# Patient Record
Sex: Female | Born: 1970 | Race: White | Hispanic: No | Marital: Married | Smoking: Never smoker
Health system: Southern US, Community
[De-identification: ages and names within clinical notes are randomized; demographics above are authoritative.]

## PROBLEM LIST (undated history)

## (undated) DIAGNOSIS — F909 Attention-deficit hyperactivity disorder, unspecified type: Secondary | ICD-10-CM

## (undated) DIAGNOSIS — L578 Other skin changes due to chronic exposure to nonionizing radiation: Secondary | ICD-10-CM

## (undated) DIAGNOSIS — R6882 Decreased libido: Secondary | ICD-10-CM

## (undated) DIAGNOSIS — F419 Anxiety disorder, unspecified: Secondary | ICD-10-CM

## (undated) DIAGNOSIS — G4700 Insomnia, unspecified: Secondary | ICD-10-CM

## (undated) DIAGNOSIS — Z0000 Encounter for general adult medical examination without abnormal findings: Principal | ICD-10-CM

## (undated) DIAGNOSIS — L709 Acne, unspecified: Secondary | ICD-10-CM

## (undated) HISTORY — DX: Decreased libido: R68.82

## (undated) HISTORY — PX: SKIN BIOPSY: SHX1

## (undated) HISTORY — DX: Encounter for general adult medical examination without abnormal findings: Z00.00

## (undated) HISTORY — DX: Attention-deficit hyperactivity disorder, unspecified type: F90.9

## (undated) HISTORY — DX: Anxiety disorder, unspecified: F41.9

## (undated) HISTORY — DX: Acne, unspecified: L70.9

## (undated) HISTORY — DX: Insomnia, unspecified: G47.00

## (undated) HISTORY — DX: Other skin changes due to chronic exposure to nonionizing radiation: L57.8

---
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
## 2000-04-06 ENCOUNTER — Other Ambulatory Visit: Admission: RE | Admit: 2000-04-06 | Discharge: 2000-04-06 | Payer: Self-pay | Admitting: Obstetrics and Gynecology

## 2001-04-12 ENCOUNTER — Other Ambulatory Visit: Admission: RE | Admit: 2001-04-12 | Discharge: 2001-04-12 | Payer: Self-pay | Admitting: Obstetrics and Gynecology

## 2004-01-14 HISTORY — PX: ANTERIOR CRUCIATE LIGAMENT REPAIR: SHX115

## 2004-01-24 ENCOUNTER — Ambulatory Visit (HOSPITAL_COMMUNITY): Admission: RE | Admit: 2004-01-24 | Discharge: 2004-01-24 | Payer: Self-pay | Admitting: Orthopedic Surgery

## 2004-01-24 ENCOUNTER — Ambulatory Visit (HOSPITAL_BASED_OUTPATIENT_CLINIC_OR_DEPARTMENT_OTHER): Admission: RE | Admit: 2004-01-24 | Discharge: 2004-01-24 | Payer: Self-pay | Admitting: Orthopedic Surgery

## 2004-04-09 ENCOUNTER — Other Ambulatory Visit: Admission: RE | Admit: 2004-04-09 | Discharge: 2004-04-09 | Payer: Self-pay | Admitting: Obstetrics and Gynecology

## 2004-10-21 ENCOUNTER — Inpatient Hospital Stay (HOSPITAL_COMMUNITY): Admission: AD | Admit: 2004-10-21 | Discharge: 2004-10-23 | Payer: Self-pay | Admitting: Obstetrics and Gynecology

## 2004-10-24 ENCOUNTER — Encounter: Admission: RE | Admit: 2004-10-24 | Discharge: 2004-11-23 | Payer: Self-pay | Admitting: Obstetrics and Gynecology

## 2004-11-24 ENCOUNTER — Encounter: Admission: RE | Admit: 2004-11-24 | Discharge: 2004-12-24 | Payer: Self-pay | Admitting: Obstetrics and Gynecology

## 2004-12-15 ENCOUNTER — Other Ambulatory Visit: Admission: RE | Admit: 2004-12-15 | Discharge: 2004-12-15 | Payer: Self-pay | Admitting: Obstetrics and Gynecology

## 2005-01-24 ENCOUNTER — Encounter: Admission: RE | Admit: 2005-01-24 | Discharge: 2005-02-23 | Payer: Self-pay | Admitting: Obstetrics and Gynecology

## 2005-02-24 ENCOUNTER — Encounter: Admission: RE | Admit: 2005-02-24 | Discharge: 2005-03-25 | Payer: Self-pay | Admitting: Obstetrics and Gynecology

## 2005-03-26 ENCOUNTER — Encounter: Admission: RE | Admit: 2005-03-26 | Discharge: 2005-04-25 | Payer: Self-pay | Admitting: Obstetrics and Gynecology

## 2005-04-26 ENCOUNTER — Encounter: Admission: RE | Admit: 2005-04-26 | Discharge: 2005-05-25 | Payer: Self-pay | Admitting: Obstetrics and Gynecology

## 2005-05-13 ENCOUNTER — Ambulatory Visit: Payer: Self-pay | Admitting: Family Medicine

## 2005-05-26 ENCOUNTER — Encounter: Admission: RE | Admit: 2005-05-26 | Discharge: 2005-06-25 | Payer: Self-pay | Admitting: Obstetrics and Gynecology

## 2006-10-19 ENCOUNTER — Encounter (INDEPENDENT_AMBULATORY_CARE_PROVIDER_SITE_OTHER): Payer: Self-pay | Admitting: Specialist

## 2006-10-19 ENCOUNTER — Inpatient Hospital Stay (HOSPITAL_COMMUNITY): Admission: AD | Admit: 2006-10-19 | Discharge: 2006-10-22 | Payer: Self-pay | Admitting: Obstetrics and Gynecology

## 2006-10-23 ENCOUNTER — Encounter: Admission: RE | Admit: 2006-10-23 | Discharge: 2006-11-22 | Payer: Self-pay | Admitting: Obstetrics and Gynecology

## 2006-11-23 ENCOUNTER — Encounter: Admission: RE | Admit: 2006-11-23 | Discharge: 2006-12-22 | Payer: Self-pay | Admitting: Obstetrics and Gynecology

## 2006-12-23 ENCOUNTER — Encounter: Admission: RE | Admit: 2006-12-23 | Discharge: 2007-01-22 | Payer: Self-pay | Admitting: Obstetrics and Gynecology

## 2007-01-23 ENCOUNTER — Encounter: Admission: RE | Admit: 2007-01-23 | Discharge: 2007-02-22 | Payer: Self-pay | Admitting: Obstetrics and Gynecology

## 2007-02-23 ENCOUNTER — Encounter: Admission: RE | Admit: 2007-02-23 | Discharge: 2007-03-24 | Payer: Self-pay | Admitting: Obstetrics and Gynecology

## 2007-03-25 ENCOUNTER — Encounter: Admission: RE | Admit: 2007-03-25 | Discharge: 2007-04-24 | Payer: Self-pay | Admitting: Obstetrics and Gynecology

## 2007-04-25 ENCOUNTER — Encounter: Admission: RE | Admit: 2007-04-25 | Discharge: 2007-05-24 | Payer: Self-pay | Admitting: Obstetrics and Gynecology

## 2007-05-25 ENCOUNTER — Encounter: Admission: RE | Admit: 2007-05-25 | Discharge: 2007-06-24 | Payer: Self-pay | Admitting: Obstetrics and Gynecology

## 2007-06-25 ENCOUNTER — Encounter: Admission: RE | Admit: 2007-06-25 | Discharge: 2007-07-25 | Payer: Self-pay | Admitting: Obstetrics and Gynecology

## 2007-07-26 ENCOUNTER — Encounter: Admission: RE | Admit: 2007-07-26 | Discharge: 2007-08-22 | Payer: Self-pay | Admitting: Obstetrics and Gynecology

## 2007-08-23 ENCOUNTER — Encounter: Admission: RE | Admit: 2007-08-23 | Discharge: 2007-09-22 | Payer: Self-pay | Admitting: Obstetrics and Gynecology

## 2007-09-23 ENCOUNTER — Encounter: Admission: RE | Admit: 2007-09-23 | Discharge: 2007-10-22 | Payer: Self-pay | Admitting: Obstetrics and Gynecology

## 2007-10-23 ENCOUNTER — Encounter: Admission: RE | Admit: 2007-10-23 | Discharge: 2007-10-24 | Payer: Self-pay | Admitting: Obstetrics and Gynecology

## 2008-06-12 ENCOUNTER — Telehealth: Payer: Self-pay | Admitting: Family Medicine

## 2008-06-22 ENCOUNTER — Telehealth: Payer: Self-pay | Admitting: *Deleted

## 2008-06-23 ENCOUNTER — Ambulatory Visit: Payer: Self-pay | Admitting: Family Medicine

## 2008-06-23 DIAGNOSIS — J189 Pneumonia, unspecified organism: Secondary | ICD-10-CM

## 2008-07-07 ENCOUNTER — Ambulatory Visit: Payer: Self-pay | Admitting: Family Medicine

## 2008-07-10 ENCOUNTER — Ambulatory Visit: Payer: Self-pay | Admitting: Family Medicine

## 2009-04-19 ENCOUNTER — Emergency Department (HOSPITAL_BASED_OUTPATIENT_CLINIC_OR_DEPARTMENT_OTHER): Admission: EM | Admit: 2009-04-19 | Discharge: 2009-04-19 | Payer: Self-pay | Admitting: Emergency Medicine

## 2009-04-19 ENCOUNTER — Ambulatory Visit: Payer: Self-pay | Admitting: Interventional Radiology

## 2010-06-01 ENCOUNTER — Encounter: Payer: Self-pay | Admitting: Pulmonary Disease

## 2010-06-26 ENCOUNTER — Encounter: Payer: Self-pay | Admitting: Pulmonary Disease

## 2010-07-28 ENCOUNTER — Institutional Professional Consult (permissible substitution) (INDEPENDENT_AMBULATORY_CARE_PROVIDER_SITE_OTHER): Payer: BC Managed Care – PPO | Admitting: Pulmonary Disease

## 2010-07-28 ENCOUNTER — Encounter: Payer: Self-pay | Admitting: Pulmonary Disease

## 2010-07-28 DIAGNOSIS — R9389 Abnormal findings on diagnostic imaging of other specified body structures: Secondary | ICD-10-CM

## 2010-07-28 DIAGNOSIS — R930 Abnormal findings on diagnostic imaging of skull and head, not elsewhere classified: Secondary | ICD-10-CM | POA: Insufficient documentation

## 2010-08-06 NOTE — Assessment & Plan Note (Signed)
Summary: questionable sarcodosis/jd   Visit Type:  Initial Consult Copy to:  Dr. Cleta Alberts Primary Provider/Referring Provider:  Dr. Amador Cunas  CC:  Pulmonary consult. Abnormal cxr.  History of Present Illness: 39/F, never smoker for evaluation of abnormal ACE levels to ' r/o sarcoidosis' Went to [facility]  -- cold in dec '11 , cough would not go away, sons sick ages 4 & 3  ACE 55, WC 6.2 , Hb 14 CXR 12/11 peribronchial thickening, repeated on jan' 12  - clear, no mention of LNs  Last such episode was 47yrs ago >> diagnosed with RUL pna after 2 MD visits Otherwise, she has no respiratory symptoms - denies cough, shortness of breath, skin lesions or eye problems. She runs upto 10 miles/ week, about 3-4 miles at a time . CXR in our system from jan'2010 appears nml  Preventive Screening-Counseling & Management  Alcohol-Tobacco     Alcohol drinks/day: rare     Smoking Status: never  Current Medications (verified): 1)  Multivitamins   Tabs (Multiple Vitamin) .... Take 1 Tablet By Mouth Once A Day 2)  B12 Powder .... 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  Smoking Status:  never Alcohol drinks/day:  rare  Review of Systems       The patient complains of productive cough and non-productive cough.  The patient denies shortness of breath with activity, shortness  of breath at rest, coughing up blood, chest pain, irregular heartbeats, acid heartburn, indigestion, loss of appetite, weight change, abdominal pain, difficulty swallowing, sore throat, tooth/dental problems, headaches, nasal congestion/difficulty breathing through nose, sneezing, itching, ear ache, anxiety, depression, hand/feet swelling, joint stiffness or pain, rash, change in color of mucus, and fever.    Vital Signs:  Patient profile:   40 year old female Height:      60 inches Weight:      133.2 pounds BMI:     26.11 O2 Sat:      98 % on Room air Temp:     98.1 degrees F oral Pulse rate:   106 / minute BP sitting:   110 / 80  (right arm) Cuff size:   regular  Vitals Entered By: Zackery Barefoot CMA (July 28, 2010 2:41 PM)  O2 Flow:  Room air CC: Pulmonary consult. Abnormal cxr Comments Medications reviewed with patient Verified contact number and pharmacy with patient Zackery Barefoot CMA  July 28, 2010 2:41 PM    Physical Exam  Additional Exam:  Gen. Pleasant, well-nourished, in no distress, normal affect ENT - no lesions, no post nasal drip Neck: No JVD, no thyromegaly, no carotid bruits Lungs: no use of accessory muscles, no dullness to percussion, clear without rales or rhonchi  Cardiovascular: Rhythm regular, heart sounds  normal, no murmurs or gallops, no peripheral edema Abdomen: soft and non-tender, no hepatosplenomegaly, BS normal. Musculoskeletal: No deformities, no cyanosis or clubbing Neuro:  alert, non focal     Impression & Recommendations:  Problem # 1:  ABNORMAL  RADIOLOGIAL EXAMINATION (ICD-793.9)  Doubt borderline high ACE level is of any significance. Will obtain CXR images to review but ' peribronchial thickening' seems to have resolved on FU film. None of the characteristic changes of sarcoid such as lymphadenopathy noted. She does not seem to have systemic involvement & lung function based on history appears adequate. As such , we discussed  common symptoms of sarcoid & she will report back if any of these arise or are perssitent . No further testing needed at this time.  Orders: Consultation Level III (04540)  Medications Added to Medication List This Visit: 1)  Multivitamins Tabs (Multiple vitamin) .... Take 1 tablet by mouth once a day 2)  B12 Powder  .... Daily  Patient Instructions: 1)  Copy sent to: dr Cleta Alberts 2)  No more testing indicated at this time 3)  Please schedule a follow-up appointment as needed. 4)  Let me know if  5)  - cough that wont go away 6)  -shortness of breath 7)  - red spots on your skin that wont go away 8)  -joint swellings    Immunization History:  Influenza Immunization History:    Influenza:  historical (04/21/2010)

## 2010-09-17 LAB — BASIC METABOLIC PANEL
Chloride: 105 mEq/L (ref 96–112)
Creatinine, Ser: 0.7 mg/dL (ref 0.4–1.2)
GFR calc Af Amer: >60 mL/min (ref >60)
GFR calc non Af Amer: >60 mL/min (ref >60)
Potassium: 4 mEq/L (ref 3.5–5.1)

## 2010-09-17 LAB — RAPID STREP SCREEN (MED CTR MEBANE ONLY): Streptococcus, Group A Screen (Direct): NEGATIVE

## 2010-09-17 LAB — CBC
MCV: 89.8 fL (ref 78.0–100.0)
Platelets: 143 10*3/uL — ABNORMAL LOW (ref 150–400)
RBC: 4.22 MIL/uL (ref 3.87–5.11)
WBC: 3.3 10*3/uL — ABNORMAL LOW (ref 4.0–10.5)

## 2010-09-17 LAB — URINALYSIS, ROUTINE W REFLEX MICROSCOPIC
Glucose, UA: NEGATIVE mg/dL
Ketones, ur: NEGATIVE mg/dL
Nitrite: NEGATIVE
Specific Gravity, Urine: 1.023 (ref 1.005–1.030)
pH: 7.5 (ref 5.0–8.0)

## 2010-09-17 LAB — DIFFERENTIAL
Eosinophils Absolute: 0.1 10*3/uL (ref 0.0–0.7)
Lymphocytes Relative: 50 % — ABNORMAL HIGH (ref 12–46)
Lymphs Abs: 1.8 10*3/uL (ref 0.7–4.0)
Monocytes Relative: 14 % — ABNORMAL HIGH (ref 3–12)
Neutrophils Relative %: 31 % — ABNORMAL LOW (ref 43–77)

## 2010-09-17 LAB — HEPATIC FUNCTION PANEL
Bilirubin, Direct: 0 mg/dL (ref 0.0–0.3)
Indirect Bilirubin: 0.4 mg/dL (ref 0.3–0.9)
Total Protein: 6 g/dL (ref 6.0–8.3)

## 2010-09-17 LAB — MONONUCLEOSIS SCREEN: Mono Screen: NEGATIVE

## 2010-09-17 LAB — URINE MICROSCOPIC-ADD ON

## 2010-09-17 LAB — PREGNANCY, URINE: Preg Test, Ur: NEGATIVE

## 2010-10-31 NOTE — Op Note (Signed)
NAME:  Anna May, Anna May             ACCOUNT NO.:  [id number]   MEDICAL RECORD NO.:  [id number]          PATIENT TYPE:  INP   LOCATION:  9174                          FACILITY:  [facility]   PHYSICIAN:  Duke Salvia. Marcelle Overlie, M.D.DATE OF BIRTH:  January 09, 1971   DATE OF PROCEDURE:  10/21/2004  DATE OF DISCHARGE:                                 OPERATIVE REPORT   DELIVERY NOTE:  The patient had a 2+ hour second stage, was noted to be  straight OA, +3 station, you could see the vertex without spreading the  labia.  Due to exhaustion, we discussed VE assist delivery which she  consented to.  The bladder was drained after she was prepped and draped.  The Kiwi VE was applied, traction effort coordinated with maternal pushing  effort x 1 effort over intact perineum with epidural and local anesthetic in  the perineum.  Apgars 8 and 9, female, pH was sent and is pending, placenta  was delivered spontaneously intact.  After the placenta was delivered, she  was given IV Pitocin.  Estimated blood loss 350.  Exploration revealed a  small fourth degree laceration.  An oversized glove was used to explore the  rectum which revealed, again, just a very low small fourth degree with a 75%  laceration of the sphincter muscle and capsule.  The rectal mucosa was  reapproximated with a 3-0 chromic on an SH needle.  Allis clamps were used  to grasp the ends of the sphincter capsule and muscle and this was  reapproximated with interrupted 2-0 Vicryl sutures in figure-of-eight  fashion x 4.  The vaginal mucosa was then closed with a running locked 3-0  Vicryl repeat suture, 3-0 Vicryl repeat sutures were used in interrupted  fashion to reapproximate the deep perineal tissue and perineal skin with  excellent hemostasis.  Mother and baby doing well at that point.  Pre-  partum, she had received IV antibiotics for GBS prophylaxis.      RMH/MEDQ  D:  10/21/2004  T:  10/21/2004  Job:  [id number]

## 2012-07-16 LAB — HM PAP SMEAR

## 2012-07-16 LAB — HM MAMMOGRAPHY

## 2012-07-18 ENCOUNTER — Other Ambulatory Visit: Payer: Self-pay | Admitting: Obstetrics and Gynecology

## 2012-09-16 ENCOUNTER — Encounter: Payer: Self-pay | Admitting: Family Medicine

## 2012-09-16 ENCOUNTER — Ambulatory Visit (INDEPENDENT_AMBULATORY_CARE_PROVIDER_SITE_OTHER): Payer: BC Managed Care – PPO | Admitting: Family Medicine

## 2012-09-16 VITALS — BP 123/88 | HR 62 | Temp 97.3°F | Ht 60.0 in | Wt 122.0 lb

## 2012-09-16 DIAGNOSIS — G4700 Insomnia, unspecified: Secondary | ICD-10-CM

## 2012-09-16 DIAGNOSIS — L709 Acne, unspecified: Secondary | ICD-10-CM

## 2012-09-16 DIAGNOSIS — L708 Other acne: Secondary | ICD-10-CM

## 2012-09-16 DIAGNOSIS — Z0000 Encounter for general adult medical examination without abnormal findings: Secondary | ICD-10-CM

## 2012-09-16 DIAGNOSIS — L578 Other skin changes due to chronic exposure to nonionizing radiation: Secondary | ICD-10-CM

## 2012-09-16 DIAGNOSIS — Z309 Encounter for contraceptive management, unspecified: Secondary | ICD-10-CM

## 2012-09-16 DIAGNOSIS — R6882 Decreased libido: Secondary | ICD-10-CM

## 2012-09-16 DIAGNOSIS — R5381 Other malaise: Secondary | ICD-10-CM

## 2012-09-16 HISTORY — DX: Acne, unspecified: L70.9

## 2012-09-16 HISTORY — DX: Insomnia, unspecified: G47.00

## 2012-09-16 HISTORY — DX: Decreased libido: R68.82

## 2012-09-16 HISTORY — DX: Other skin changes due to chronic exposure to nonionizing radiation: L57.8

## 2012-09-16 HISTORY — DX: Encounter for general adult medical examination without abnormal findings: Z00.00

## 2012-09-16 LAB — CBC
MCHC: 33.6 g/dL (ref 30.0–36.0)
MCV: 88.5 fl (ref 78.0–100.0)
Platelets: 226 10*3/uL (ref 150.0–400.0)
RDW: 13.8 % (ref 11.5–14.6)

## 2012-09-16 MED ORDER — NORGESTIM-ETH ESTRAD TRIPHASIC 0.18/0.215/0.25 MG-25 MCG PO TABS: 1.0000 | ORAL_TABLET | Freq: Every day | ORAL | Status: DC

## 2012-09-16 NOTE — Assessment & Plan Note (Signed)
Check testosterone, fsh and lh levels today

## 2012-09-16 NOTE — Assessment & Plan Note (Signed)
Start a probiotic, try [employer] and start ortho tricyclen lo

## 2012-09-16 NOTE — Assessment & Plan Note (Signed)
Encouraged good sleep hygiene and no exercising within 2 hours of bedtime

## 2012-09-16 NOTE — Patient Instructions (Addendum)
MegaRed caps daily Dig[employer]vantage probiotic  Preventive Care for Adults, Female A healthy lifestyle and preventive care can promote health and wellness. Preventive health guidelines for women include the following key practices.  A routine yearly physical is a good way to check with your caregiver about your health and preventive screening. It is a chance to share any concerns and updates on your health, and to receive a thorough exam.  Visit your dentist for a routine exam and preventive care every 6 months. Brush your teeth twice a day and floss once a day. Good oral hygiene prevents tooth decay and gum disease.  The frequency of eye exams is based on your age, health, family medical history, use of contact lenses, and other factors. Follow your caregiver's recommendations for frequency of eye exams.  Eat a healthy diet. Foods like vegetables, fruits, whole grains, low-fat dairy products, and lean protein foods contain the nutrients you need without too many calories. Decrease your intake of foods high in solid fats, added sugars, and salt. Eat the right amount of calories for you.Get information about a proper diet from your caregiver, if necessary.  Regular physical exercise is one of the most important things you can do for your health. Most adults should get at least 150 minutes of moderate-intensity exercise (any activity that increases your heart rate and causes you to sweat) each week. In addition, most adults need muscle-strengthening exercises on 2 or more days a week.  Maintain a healthy weight. The body mass index (BMI) is a screening tool to identify possible weight problems. It provides an estimate of body fat based on height and weight. Your caregiver can help determine your BMI, and can help you achieve or maintain a healthy weight.For adults 20 years and older:  A BMI below 18.5 is considered underweight.  A BMI of 18.5 to 24.9 is normal.  A BMI of 25 to 29.9 is  considered overweight.  A BMI of 30 and above is considered obese.  Maintain normal blood lipids and cholesterol levels by exercising and minimizing your intake of saturated fat. Eat a balanced diet with plenty of fruit and vegetables. Blood tests for lipids and cholesterol should begin at age 28 and be repeated every 5 years. If your lipid or cholesterol levels are high, you are over 50, or you are at high risk for heart disease, you may need your cholesterol levels checked more frequently.Ongoing high lipid and cholesterol levels should be treated with medicines if diet and exercise are not effective.  If you smoke, find out from your caregiver how to quit. If you do not use tobacco, do not start.  If you are pregnant, do not drink alcohol. If you are breastfeeding, be very cautious about drinking alcohol. If you are not pregnant and choose to drink alcohol, do not exceed 1 drink per day. One drink is considered to be 12 ounces (355 mL) of beer, 5 ounces (148 mL) of wine, or 1.5 ounces (44 mL) of liquor.  Avoid use of street drugs. Do not share needles with anyone. Ask for help if you need support or instructions about stopping the use of drugs.  High blood pressure causes heart disease and increases the risk of stroke. Your blood pressure should be checked at least every 1 to 2 years. Ongoing high blood pressure should be treated with medicines if weight loss and exercise are not effective.  If you are 35 to 42 years old, ask your caregiver if you should  take aspirin to prevent strokes.  Diabetes screening involves taking a blood sample to check your fasting blood sugar level. This should be done once every 3 years, after age 81, if you are within normal weight and without risk factors for diabetes. Testing should be considered at a younger age or be carried out more frequently if you are overweight and have at least 1 risk factor for diabetes.  Breast cancer screening is essential preventive  care for women. You should practice "breast self-awareness." This means understanding the normal appearance and feel of your breasts and may include breast self-examination. Any changes detected, no matter how small, should be reported to a caregiver. Women in their 64s and 30s should have a clinical breast exam (CBE) by a caregiver as part of a regular health exam every 1 to 3 years. After age 18, women should have a CBE every year. Starting at age 107, women should consider having a mammography (breast X-ray test) every year. Women who have a family history of breast cancer should talk to their caregiver about genetic screening. Women at a high risk of breast cancer should talk to their caregivers about having magnetic resonance imaging (MRI) and a mammography every year.  The Pap test is a screening test for cervical cancer. A Pap test can show cell changes on the cervix that might become cervical cancer if left untreated. A Pap test is a procedure in which cells are obtained and examined from the lower end of the uterus (cervix).  Women should have a Pap test starting at age 37.  Between ages 54 and 52, Pap tests should be repeated every 2 years.  Beginning at age 2, you should have a Pap test every 3 years as long as the past 3 Pap tests have been normal.  Some women have medical problems that increase the chance of getting cervical cancer. Talk to your caregiver about these problems. It is especially important to talk to your caregiver if a new problem develops soon after your last Pap test. In these cases, your caregiver may recommend more frequent screening and Pap tests.  The above recommendations are the same for women who have or have not gotten the vaccine for human papillomavirus (HPV).  If you had a hysterectomy for a problem that was not cancer or a condition that could lead to cancer, then you no longer need Pap tests. Even if you no longer need a Pap test, a regular exam is a good idea  to make sure no other problems are starting.  If you are between ages 23 and 37, and you have had normal Pap tests going back 10 years, you no longer need Pap tests. Even if you no longer need a Pap test, a regular exam is a good idea to make sure no other problems are starting.  If you have had past treatment for cervical cancer or a condition that could lead to cancer, you need Pap tests and screening for cancer for at least 20 years after your treatment.  If Pap tests have been discontinued, risk factors (such as a new sexual partner) need to be reassessed to determine if screening should be resumed.  The HPV test is an additional test that may be used for cervical cancer screening. The HPV test looks for the virus that can cause the cell changes on the cervix. The cells collected during the Pap test can be tested for HPV. The HPV test could be used to screen women  aged 30 years and older, and should be used in women of any age who have unclear Pap test results. After the age of 29, women should have HPV testing at the same frequency as a Pap test.  Colorectal cancer can be detected and often prevented. Most routine colorectal cancer screening begins at the age of 49 and continues through age 37. However, your caregiver may recommend screening at an earlier age if you have risk factors for colon cancer. On a yearly basis, your caregiver may provide home test kits to check for hidden blood in the stool. Use of a small camera at the end of a tube, to directly examine the colon (sigmoidoscopy or colonoscopy), can detect the earliest forms of colorectal cancer. Talk to your caregiver about this at age 34, when routine screening begins. Direct examination of the colon should be repeated every 5 to 10 years through age 27, unless early forms of pre-cancerous polyps or small growths are found.  Hepatitis C blood testing is recommended for all people born from [birth year] through 1965 and any individual with known  risks for hepatitis C.  Practice safe sex. Use condoms and avoid high-risk sexual practices to reduce the spread of sexually transmitted infections (STIs). STIs include gonorrhea, chlamydia, syphilis, trichomonas, herpes, HPV, and human immunodeficiency virus (HIV). Herpes, HIV, and HPV are viral illnesses that have no cure. They can result in disability, cancer, and death. Sexually active women aged 2 and younger should be checked for chlamydia. Older women with new or multiple partners should also be tested for chlamydia. Testing for other STIs is recommended if you are sexually active and at increased risk.  Osteoporosis is a disease in which the bones lose minerals and strength with aging. This can result in serious bone fractures. The risk of osteoporosis can be identified using a bone density scan. Women ages 20 and over and women at risk for fractures or osteoporosis should discuss screening with their caregivers. Ask your caregiver whether you should take a calcium supplement or vitamin D to reduce the rate of osteoporosis.  Menopause can be associated with physical symptoms and risks. Hormone replacement therapy is available to decrease symptoms and risks. You should talk to your caregiver about whether hormone replacement therapy is right for you.  Use sunscreen with sun protection factor (SPF) of 30 or more. Apply sunscreen liberally and repeatedly throughout the day. You should seek shade when your shadow is shorter than you. Protect yourself by wearing long sleeves, pants, a wide-brimmed hat, and sunglasses year round, whenever you are outdoors.  Once a month, do a whole body skin exam, using a mirror to look at the skin on your back. Notify your caregiver of new moles, moles that have irregular borders, moles that are larger than a pencil eraser, or moles that have changed in shape or color.  Stay current with required immunizations.  Influenza. You need a dose every fall (or winter).  The composition of the flu vaccine changes each year, so being vaccinated once is not enough.  Pneumococcal polysaccharide. You need 1 to 2 doses if you smoke cigarettes or if you have certain chronic medical conditions. You need 1 dose at age 40 (or older) if you have never been vaccinated.  Tetanus, diphtheria, pertussis (Tdap, Td). Get 1 dose of Tdap vaccine if you are younger than age 40, are over 39 and have contact with an infant, are a Research scientist (physical sciences), are pregnant, or simply want to be protected from whooping  cough. After that, you need a Td booster dose every 10 years. Consult your caregiver if you have not had at least 3 tetanus and diphtheria-containing shots sometime in your life or have a deep or dirty wound.  HPV. You need this vaccine if you are a woman age 46 or younger. The vaccine is given in 3 doses over 6 months.  Measles, mumps, rubella (MMR). You need at least 1 dose of MMR if you were born in 1957 or later. You may also need a second dose.  Meningococcal. If you are age 69 to 52 and a first-year college student living in a residence hall, or have one of several medical conditions, you need to get vaccinated against meningococcal disease. You may also need additional booster doses.  Zoster (shingles). If you are age 78 or older, you should get this vaccine.  Varicella (chickenpox). If you have never had chickenpox or you were vaccinated but received only 1 dose, talk to your caregiver to find out if you need this vaccine.  Hepatitis A. You need this vaccine if you have a specific risk factor for hepatitis A virus infection or you simply wish to be protected from this disease. The vaccine is usually given as 2 doses, 6 to 18 months apart.  Hepatitis B. You need this vaccine if you have a specific risk factor for hepatitis B virus infection or you simply wish to be protected from this disease. The vaccine is given in 3 doses, usually over 6 months. Preventive Services /  Frequency Ages 38 to 52  Blood pressure check.** / Every 1 to 2 years.  Lipid and cholesterol check.** / Every 5 years beginning at age 41.  Clinical breast exam.** / Every 3 years for women in their 56s and 30s.  Pap test.** / Every 2 years from ages 66 through 20. Every 3 years starting at age 35 through age 72 or 53 with a history of 3 consecutive normal Pap tests.  HPV screening.** / Every 3 years from ages 18 through ages 15 to 66 with a history of 3 consecutive normal Pap tests.  Hepatitis C blood test.** / For any individual with known risks for hepatitis C.  Skin self-exam. / Monthly.  Influenza immunization.** / Every year.  Pneumococcal polysaccharide immunization.** / 1 to 2 doses if you smoke cigarettes or if you have certain chronic medical conditions.  Tetanus, diphtheria, pertussis (Tdap, Td) immunization. / A one-time dose of Tdap vaccine. After that, you need a Td booster dose every 10 years.  HPV immunization. / 3 doses over 6 months, if you are 37 and younger.  Measles, mumps, rubella (MMR) immunization. / You need at least 1 dose of MMR if you were born in 1957 or later. You may also need a second dose.  Meningococcal immunization. / 1 dose if you are age 41 to 57 and a first-year college student living in a residence hall, or have one of several medical conditions, you need to get vaccinated against meningococcal disease. You may also need additional booster doses.  Varicella immunization.** / Consult your caregiver.  Hepatitis A immunization.** / Consult your caregiver. 2 doses, 6 to 18 months apart.  Hepatitis B immunization.** / Consult your caregiver. 3 doses usually over 6 months. Ages 41 to 27  Blood pressure check.** / Every 1 to 2 years.  Lipid and cholesterol check.** / Every 5 years beginning at age 63.  Clinical breast exam.** / Every year after age 39.  Mammogram.** /  Every year beginning at age 76 and continuing for as long as you are in  good health. Consult with your caregiver.  Pap test.** / Every 3 years starting at age 71 through age 24 or 70 with a history of 3 consecutive normal Pap tests.  HPV screening.** / Every 3 years from ages 71 through ages 74 to 52 with a history of 3 consecutive normal Pap tests.  Fecal occult blood test (FOBT) of stool. / Every year beginning at age 91 and continuing until age 29. You may not need to do this test if you get a colonoscopy every 10 years.  Flexible sigmoidoscopy or colonoscopy.** / Every 5 years for a flexible sigmoidoscopy or every 10 years for a colonoscopy beginning at age 79 and continuing until age 35.  Hepatitis C blood test.** / For all people born from [birth year] through 1965 and any individual with known risks for hepatitis C.  Skin self-exam. / Monthly.  Influenza immunization.** / Every year.  Pneumococcal polysaccharide immunization.** / 1 to 2 doses if you smoke cigarettes or if you have certain chronic medical conditions.  Tetanus, diphtheria, pertussis (Tdap, Td) immunization.** / A one-time dose of Tdap vaccine. After that, you need a Td booster dose every 10 years.  Measles, mumps, rubella (MMR) immunization. / You need at least 1 dose of MMR if you were born in 1957 or later. You may also need a second dose.  Varicella immunization.** / Consult your caregiver.  Meningococcal immunization.** / Consult your caregiver.  Hepatitis A immunization.** / Consult your caregiver. 2 doses, 6 to 18 months apart.  Hepatitis B immunization.** / Consult your caregiver. 3 doses, usually over 6 months. Ages 25 and over  Blood pressure check.** / Every 1 to 2 years.  Lipid and cholesterol check.** / Every 5 years beginning at age 105.  Clinical breast exam.** / Every year after age 80.  Mammogram.** / Every year beginning at age 81 and continuing for as long as you are in good health. Consult with your caregiver.  Pap test.** / Every 3 years starting at age 54 through  age 89 or 81 with a 3 consecutive normal Pap tests. Testing can be stopped between 65 and 70 with 3 consecutive normal Pap tests and no abnormal Pap or HPV tests in the past 10 years.  HPV screening.** / Every 3 years from ages 79 through ages 58 or 15 with a history of 3 consecutive normal Pap tests. Testing can be stopped between 65 and 70 with 3 consecutive normal Pap tests and no abnormal Pap or HPV tests in the past 10 years.  Fecal occult blood test (FOBT) of stool. / Every year beginning at age 71 and continuing until age 37. You may not need to do this test if you get a colonoscopy every 10 years.  Flexible sigmoidoscopy or colonoscopy.** / Every 5 years for a flexible sigmoidoscopy or every 10 years for a colonoscopy beginning at age 13 and continuing until age 24.  Hepatitis C blood test.** / For all people born from [birth year] through 1965 and any individual with known risks for hepatitis C.  Osteoporosis screening.** / A one-time screening for women ages 59 and over and women at risk for fractures or osteoporosis.  Skin self-exam. / Monthly.  Influenza immunization.** / Every year.  Pneumococcal polysaccharide immunization.** / 1 dose at age 53 (or older) if you have never been vaccinated.  Tetanus, diphtheria, pertussis (Tdap, Td) immunization. / A one-time dose of  Tdap vaccine if you are over 65 and have contact with an infant, are a Research scientist (physical sciences), or simply want to be protected from whooping cough. After that, you need a Td booster dose every 10 years.  Varicella immunization.** / Consult your caregiver.  Meningococcal immunization.** / Consult your caregiver.  Hepatitis A immunization.** / Consult your caregiver. 2 doses, 6 to 18 months apart.  Hepatitis B immunization.** / Check with your caregiver. 3 doses, usually over 6 months. ** Family history and personal history of risk and conditions may change your caregiver's recommendations. Document Released: 07/28/2001 Document  Revised: 08/24/2011 Document Reviewed: 10/27/2010 [facility] Patient Information 2013 [place], [place].   Insomnia Insomnia is frequent trouble falling and/or staying asleep. Insomnia can be a long term problem or a short term problem. Both are common. Insomnia can be a short term problem when the wakefulness is related to a certain stress or worry. Long term insomnia is often related to ongoing stress during waking hours and/or poor sleeping habits. Overtime, sleep deprivation itself can make the problem worse. Every little thing feels more severe because you are overtired and your ability to cope is decreased. CAUSES   Stress, anxiety, and depression.  Poor sleeping habits.  Distractions such as TV in the bedroom.  Naps close to bedtime.  Engaging in emotionally charged conversations before bed.  Technical reading before sleep.  Alcohol and other sedatives. They may make the problem worse. They can hurt normal sleep patterns and normal dream activity.  Stimulants such as caffeine for several hours prior to bedtime.  Pain syndromes and shortness of breath can cause insomnia.  Exercise late at night.  Changing time zones may cause sleeping problems (jet lag). It is sometimes helpful to have someone observe your sleeping patterns. They should look for periods of not breathing during the night (sleep apnea). They should also look to see how long those periods last. If you live alone or observers are uncertain, you can also be observed at a sleep clinic where your sleep patterns will be professionally monitored. Sleep apnea requires a checkup and treatment. Give your caregivers your medical history. Give your caregivers observations your family has made about your sleep.  SYMPTOMS   Not feeling rested in the morning.  Anxiety and restlessness at bedtime.  Difficulty falling and staying asleep. TREATMENT   Your caregiver may prescribe treatment for an underlying medical disorders.  Your caregiver can give advice or help if you are using alcohol or other drugs for self-medication. Treatment of underlying problems will usually eliminate insomnia problems.  Medications can be prescribed for short time use. They are generally not recommended for lengthy use.  Over-the-counter sleep medicines are not recommended for lengthy use. They can be habit forming.  You can promote easier sleeping by making lifestyle changes such as:  Using relaxation techniques that help with breathing and reduce muscle tension.  Exercising earlier in the day.  Changing your diet and the time of your last meal. No night time snacks.  Establish a regular time to go to bed.  Counseling can help with stressful problems and worry.  Soothing music and white noise may be helpful if there are background noises you cannot remove.  Stop tedious detailed work at least one hour before bedtime. HOME CARE INSTRUCTIONS   Keep a diary. Inform your caregiver about your progress. This includes any medication side effects. See your caregiver regularly. Take note of:  Times when you are asleep.  Times when you are awake  during the night.  The quality of your sleep.  How you feel the next day. This information will help your caregiver care for you.  Get out of bed if you are still awake after 15 minutes. Read or do some quiet activity. Keep the lights down. Wait until you feel sleepy and go back to bed.  Keep regular sleeping and waking hours. Avoid naps.  Exercise regularly.  Avoid distractions at bedtime. Distractions include watching television or engaging in any intense or detailed activity like attempting to balance the household checkbook.  Develop a bedtime ritual. Keep a familiar routine of bathing, brushing your teeth, climbing into bed at the same time each night, listening to soothing music. Routines increase the success of falling to sleep faster.  Use relaxation techniques. This can be  using breathing and muscle tension release routines. It can also include visualizing peaceful scenes. You can also help control troubling or intruding thoughts by keeping your mind occupied with boring or repetitive thoughts like the old concept of counting sheep. You can make it more creative like imagining planting one beautiful flower after another in your backyard garden.  During your day, work to eliminate stress. When this is not possible use some of the previous suggestions to help reduce the anxiety that accompanies stressful situations. MAKE SURE YOU:   Understand these instructions.  Will watch your condition.  Will get help right away if you are not doing well or get worse. Document Released: 05/29/2000 Document Revised: 08/24/2011 Document Reviewed: 06/29/2007 [facility] Patient Information 2013 [place], [place].

## 2012-09-16 NOTE — Progress Notes (Signed)
Patient ID: Anna May, female   DOB: [birth year], 42 y.o.   MRN: [id number] Anna May [id number] [birth year] 09/16/2012      Progress Note New Patient  Subjective  Chief Complaint  Chief Complaint  Patient presents with  . Establish Care    new patient- referred by Dr Vincente Poli about hormonal issues    HPI  Is a 42 year old Caucasian female who is here today to establish care. She follows with a gynecologist and has been having more complaints so her gynecologist suggested she set up with the primary care Dr. Her complaints include fatigue, dry mouth, low libido, increased facial acne and facial hair. Her cycles have become slightly irregular but been more regular again recently. A final complaint is of insomnia. She's trouble falling asleep and staying asleep. 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 LMP was about 2 weeks ago and was normal for her. She follows with dermatology Dr. Nicholas Lose and has had numerous lesions removed secondary to a family history of melanoma on both sites. So far she's only had 1 precancerous lesion. No other recent illness. She eats a heart healthy diet and exercises regularly including training for half marathons. No fevers, chest pain, palpitations, shortness of breath, GI or GU concerns noted.  Past Medical History  Diagnosis Date  . Preventative health care 09/16/2012  . Insomnia 09/16/2012  . Decreased libido 09/16/2012  . Acne 09/16/2012  . Sun-damaged skin 09/16/2012    Past Surgical History  Procedure Laterality Date  . Anterior cruciate ligament repair  01-2004    right leg - replacement  . Wisdom tooth extraction  1991    all 4  . Skin biopsy      multiple one with 17 stitches, 1 precancer lesion    Family History  Problem Relation Age of Onset  . Arthritis Mother     hands  . Cancer Maternal Grandmother     melanoma  . Cancer Maternal Grandfather 41    kidney  . Heart attack Maternal  Grandfather   . Alcohol abuse Maternal Grandfather   . Heart disease Maternal Grandfather   . Stroke Maternal Grandfather   . Cancer Paternal Grandmother     melanoma  . Cancer Paternal Grandfather     lung- smoker    History   Social History  . Marital Status: Married    Spouse Name: N/A    Number of Children: N/A  . Years of Education: N/A   Occupational History  . Not on file.   Social History Main Topics  . Smoking status: Never Smoker   . Smokeless tobacco: Never Used  . Alcohol Use: No  . Drug Use: No  . Sexually Active: Yes -- Female partner(s)   Other Topics Concern  . Not on file   Social History Narrative  . No narrative on file    No current outpatient prescriptions on file prior to visit.   No current facility-administered medications on file prior to visit.    No Known Allergies  Review of Systems  Review of Systems  Constitutional: Positive for malaise/fatigue. Negative for fever.  HENT: Negative for congestion.   Eyes: Negative for discharge.  Respiratory: Negative for shortness of breath.   Cardiovascular: Negative for chest pain, palpitations and leg swelling.  Gastrointestinal: Negative for nausea, abdominal pain and diarrhea.  Genitourinary: Negative for dysuria.  Musculoskeletal: Negative for falls.  Skin: Negative for rash.  Neurological: Negative  for loss of consciousness and headaches.  Endo/Heme/Allergies: Negative for polydipsia.  Psychiatric/Behavioral: Negative for depression and suicidal ideas. The patient has insomnia. The patient is not nervous/anxious.     Objective  BP 123/88  Pulse 62  Temp(Src) 97.3 F (36.3 C) (Temporal)  Ht 5' (1.524 m)  Wt 122 lb (55.339 kg)  BMI 23.83 kg/m2  SpO2 98%  LMP 09/02/2012  Physical Exam  Physical Exam  Constitutional: She is oriented to person, place, and time and well-developed, well-nourished, and in no distress. No distress.  HENT:  Head: Normocephalic and atraumatic.  Right  Ear: External ear normal.  Left Ear: External ear normal.  Nose: Nose normal.  Mouth/Throat: Oropharynx is clear and moist. No oropharyngeal exudate.  Eyes: Conjunctivae are normal. Pupils are equal, round, and reactive to light. Right eye exhibits no discharge. Left eye exhibits no discharge. No scleral icterus.  Neck: Normal range of motion. Neck supple. No thyromegaly present.  Cardiovascular: Normal rate, regular rhythm, normal heart sounds and intact distal pulses.   No murmur heard. Pulmonary/Chest: Effort normal and breath sounds normal. No respiratory distress. She has no wheezes. She has no rales.  Abdominal: Soft. Bowel sounds are normal. She exhibits no distension and no mass. There is no tenderness.  Musculoskeletal: Normal range of motion. She exhibits no edema and no tenderness.  Lymphadenopathy:    She has no cervical adenopathy.  Neurological: She is alert and oriented to person, place, and time. She has normal reflexes. No cranial nerve deficit. Coordination normal.  Skin: Skin is warm and dry. No rash noted. She is not diaphoretic.  Psychiatric: Mood, memory and affect normal.       Assessment & Plan  Preventative health care Encouraged heart healthy diet which she largely already does, regular exercise. Avoid trans fats, started on Ortho Tricyclen lo for contraception management and management of acne, decreased libido  Sun-damaged skin Precancerous lesion once but numerous other biopsies all benign. Encouraged sunscreen and covering of skin  Decreased libido Check testosterone, fsh and lh levels today  Insomnia Encouraged good sleep hygiene and no exercising within 2 hours of bedtime  Acne Start a probiotic, try Witch Hazel Astringent and start ortho tricyclen lo

## 2012-09-16 NOTE — Assessment & Plan Note (Signed)
Encouraged heart healthy diet which she largely already does, regular exercise. Avoid trans fats, started on Ortho Tricyclen lo for contraception management and management of acne, decreased libido

## 2012-09-16 NOTE — Assessment & Plan Note (Signed)
Precancerous lesion once but numerous other biopsies all benign. Encouraged sunscreen and covering of skin

## 2012-09-17 LAB — VITAMIN D 25 HYDROXY (VIT D DEFICIENCY, FRACTURES): Vit D, 25-Hydroxy: 27 ng/mL — ABNORMAL LOW (ref 30–89)

## 2012-09-19 LAB — RENAL FUNCTION PANEL
Albumin: 4.2 g/dL (ref 3.5–5.2)
BUN: 14 mg/dL (ref 6–23)
Chloride: 109 mEq/L (ref 96–112)
Creatinine, Ser: 0.8 mg/dL (ref 0.4–1.2)
GFR: 86.25 mL/min (ref >60.00)
Phosphorus: 3.9 mg/dL (ref 2.3–4.6)

## 2012-09-19 LAB — HEPATIC FUNCTION PANEL
AST: 39 U/L — ABNORMAL HIGH (ref 0–37)
Albumin: 4.2 g/dL (ref 3.5–5.2)
Alkaline Phosphatase: 44 U/L (ref 39–117)
Total Protein: 7.4 g/dL (ref 6.0–8.3)

## 2012-09-19 LAB — TSH: TSH: 0.66 u[IU]/mL (ref 0.35–5.50)

## 2012-09-19 LAB — LIPID PANEL: Cholesterol: 145 mg/dL (ref 0–200)

## 2012-09-19 LAB — MAGNESIUM: Magnesium: 2.1 mg/dL (ref 1.5–2.5)

## 2012-09-19 LAB — LUTEINIZING HORMONE: LH: 4.44 m[IU]/mL

## 2013-09-22 ENCOUNTER — Other Ambulatory Visit: Payer: Self-pay | Admitting: Obstetrics and Gynecology

## 2014-05-14 ENCOUNTER — Ambulatory Visit: Payer: Self-pay | Admitting: Physician Assistant

## 2014-05-16 ENCOUNTER — Ambulatory Visit (INDEPENDENT_AMBULATORY_CARE_PROVIDER_SITE_OTHER): Payer: Managed Care, Other (non HMO) | Admitting: Physician Assistant

## 2014-05-16 ENCOUNTER — Encounter: Payer: Self-pay | Admitting: Physician Assistant

## 2014-05-16 VITALS — BP 120/82 | HR 68 | Temp 97.9°F | Resp 16 | Ht 60.0 in | Wt 123.1 lb

## 2014-05-16 DIAGNOSIS — Z0000 Encounter for general adult medical examination without abnormal findings: Secondary | ICD-10-CM

## 2014-05-16 DIAGNOSIS — Z23 Encounter for immunization: Secondary | ICD-10-CM

## 2014-05-16 LAB — CBC
HEMATOCRIT: 41.2 % (ref 36.0–46.0)
HEMOGLOBIN: 13.5 g/dL (ref 12.0–15.0)
MCHC: 32.8 g/dL (ref 30.0–36.0)
MCV: 88.8 fl (ref 78.0–100.0)
Platelets: 252 10*3/uL (ref 150.0–400.0)
RBC: 4.64 Mil/uL (ref 3.87–5.11)
RDW: 13.9 % (ref 11.5–15.5)
WBC: 5.9 10*3/uL (ref 4.0–10.5)

## 2014-05-16 LAB — URINALYSIS, ROUTINE W REFLEX MICROSCOPIC
Bilirubin Urine: NEGATIVE
Hgb urine dipstick: NEGATIVE
Ketones, ur: NEGATIVE
LEUKOCYTES UA: NEGATIVE
NITRITE: NEGATIVE
PH: 6 (ref 5.0–8.0)
RBC / HPF: NONE SEEN (ref 0–?)
Specific Gravity, Urine: 1.015 (ref 1.000–1.030)
TOTAL PROTEIN, URINE-UPE24: NEGATIVE
Urine Glucose: NEGATIVE
Urobilinogen, UA: 0.2 (ref 0.0–1.0)
WBC UA: NONE SEEN (ref 0–?)

## 2014-05-16 LAB — BASIC METABOLIC PANEL
BUN: 16 mg/dL (ref 6–23)
CALCIUM: 9.2 mg/dL (ref 8.4–10.5)
CO2: 26 mEq/L (ref 19–32)
CREATININE: 0.9 mg/dL (ref 0.4–1.2)
Chloride: 104 mEq/L (ref 96–112)
GFR: 69.85 mL/min (ref >60.00)
Glucose, Bld: 83 mg/dL (ref 70–99)
Potassium: 4.4 mEq/L (ref 3.5–5.1)
Sodium: 136 mEq/L (ref 135–145)

## 2014-05-16 LAB — LIPID PANEL
CHOLESTEROL: 163 mg/dL (ref 0–200)
HDL: 70.1 mg/dL (ref >39.00)
LDL CALC: 73 mg/dL (ref 0–99)
NONHDL: 92.9
Total CHOL/HDL Ratio: 2
Triglycerides: 99 mg/dL (ref 0.0–149.0)
VLDL: 19.8 mg/dL (ref 0.0–40.0)

## 2014-05-16 LAB — HEPATIC FUNCTION PANEL
ALT: 19 U/L (ref 0–35)
AST: 23 U/L (ref 0–37)
Albumin: 4.2 g/dL (ref 3.5–5.2)
Alkaline Phosphatase: 45 U/L (ref 39–117)
BILIRUBIN DIRECT: 0 mg/dL (ref 0.0–0.3)
BILIRUBIN TOTAL: 0.5 mg/dL (ref 0.2–1.2)
Total Protein: 7.2 g/dL (ref 6.0–8.3)

## 2014-05-16 LAB — VITAMIN D 25 HYDROXY (VIT D DEFICIENCY, FRACTURES): VITD: 25.48 ng/mL — AB (ref 30.00–100.00)

## 2014-05-16 LAB — TSH: TSH: 1.52 u[IU]/mL (ref 0.35–4.50)

## 2014-05-16 NOTE — Progress Notes (Signed)
Pre visit review using our clinic review tool, if applicable. No additional management support is needed unless otherwise documented below in the visit note/SLS  

## 2014-05-16 NOTE — Patient Instructions (Signed)
Please stop by the lab for blood work. I will call you with your results and once I have faxed over your wellness form.  Keep up the great exercise regimen!  Preventive Care for Adults A healthy lifestyle and preventive care can promote health and wellness. Preventive health guidelines for women include the following key practices.  A routine yearly physical is a good way to check with your health care provider about your health and preventive screening. It is a chance to share any concerns and updates on your health and to receive a thorough exam.  Visit your dentist for a routine exam and preventive care every 6 months. Brush your teeth twice a day and floss once a day. Good oral hygiene prevents tooth decay and gum disease.  The frequency of eye exams is based on your age, health, family medical history, use of contact lenses, and other factors. Follow your health care provider's recommendations for frequency of eye exams.  Eat a healthy diet. Foods like vegetables, fruits, whole grains, low-fat dairy products, and lean protein foods contain the nutrients you need without too many calories. Decrease your intake of foods high in solid fats, added sugars, and salt. Eat the right amount of calories for you.Get information about a proper diet from your health care provider, if necessary.  Regular physical exercise is one of the most important things you can do for your health. Most adults should get at least 150 minutes of moderate-intensity exercise (any activity that increases your heart rate and causes you to sweat) each week. In addition, most adults need muscle-strengthening exercises on 2 or more days a week.  Maintain a healthy weight. The body mass index (BMI) is a screening tool to identify possible weight problems. It provides an estimate of body fat based on height and weight. Your health care provider can find your BMI and can help you achieve or maintain a healthy weight.For adults 20  years and older:  A BMI below 18.5 is considered underweight.  A BMI of 18.5 to 24.9 is normal.  A BMI of 25 to 29.9 is considered overweight.  A BMI of 30 and above is considered obese.  Maintain normal blood lipids and cholesterol levels by exercising and minimizing your intake of saturated fat. Eat a balanced diet with plenty of fruit and vegetables. Blood tests for lipids and cholesterol should begin at age 38 and be repeated every 5 years. If your lipid or cholesterol levels are high, you are over 50, or you are at high risk for heart disease, you may need your cholesterol levels checked more frequently.Ongoing high lipid and cholesterol levels should be treated with medicines if diet and exercise are not working.  If you smoke, find out from your health care provider how to quit. If you do not use tobacco, do not start.  Lung cancer screening is recommended for adults aged 36-80 years who are at high risk for developing lung cancer because of a history of smoking. A yearly low-dose CT scan of the lungs is recommended for people who have at least a 30-pack-year history of smoking and are a current smoker or have quit within the past 15 years. A pack year of smoking is smoking an average of 1 pack of cigarettes a day for 1 year (for example: 1 pack a day for 30 years or 2 packs a day for 15 years). Yearly screening should continue until the smoker has stopped smoking for at least 15 years. Yearly screening  should be stopped for people who develop a health problem that would prevent them from having lung cancer treatment.  If you are pregnant, do not drink alcohol. If you are breastfeeding, be very cautious about drinking alcohol. If you are not pregnant and choose to drink alcohol, do not have more than 1 drink per day. One drink is considered to be 12 ounces (355 mL) of beer, 5 ounces (148 mL) of wine, or 1.5 ounces (44 mL) of liquor.  Avoid use of street drugs. Do not share needles with  anyone. Ask for help if you need support or instructions about stopping the use of drugs.  High blood pressure causes heart disease and increases the risk of stroke. Your blood pressure should be checked at least every 1 to 2 years. Ongoing high blood pressure should be treated with medicines if weight loss and exercise do not work.  If you are 20-11 years old, ask your health care provider if you should take aspirin to prevent strokes.  Diabetes screening involves taking a blood sample to check your fasting blood sugar level. This should be done once every 3 years, after age 72, if you are within normal weight and without risk factors for diabetes. Testing should be considered at a younger age or be carried out more frequently if you are overweight and have at least 1 risk factor for diabetes.  Breast cancer screening is essential preventive care for women. You should practice "breast self-awareness." This means understanding the normal appearance and feel of your breasts and may include breast self-examination. Any changes detected, no matter how small, should be reported to a health care provider. Women in their 31s and 30s should have a clinical breast exam (CBE) by a health care provider as part of a regular health exam every 1 to 3 years. After age 56, women should have a CBE every year. Starting at age 64, women should consider having a mammogram (breast X-ray test) every year. Women who have a family history of breast cancer should talk to their health care provider about genetic screening. Women at a high risk of breast cancer should talk to their health care providers about having an MRI and a mammogram every year.  Breast cancer gene (BRCA)-related cancer risk assessment is recommended for women who have family members with BRCA-related cancers. BRCA-related cancers include breast, ovarian, tubal, and peritoneal cancers. Having family members with these cancers may be associated with an  increased risk for harmful changes (mutations) in the breast cancer genes BRCA1 and BRCA2. Results of the assessment will determine the need for genetic counseling and BRCA1 and BRCA2 testing.  Routine pelvic exams to screen for cancer are no longer recommended for nonpregnant women who are considered low risk for cancer of the pelvic organs (ovaries, uterus, and vagina) and who do not have symptoms. Ask your health care provider if a screening pelvic exam is right for you.  If you have had past treatment for cervical cancer or a condition that could lead to cancer, you need Pap tests and screening for cancer for at least 20 years after your treatment. If Pap tests have been discontinued, your risk factors (such as having a new sexual partner) need to be reassessed to determine if screening should be resumed. Some women have medical problems that increase the chance of getting cervical cancer. In these cases, your health care provider may recommend more frequent screening and Pap tests.  The HPV test is an additional test that  may be used for cervical cancer screening. The HPV test looks for the virus that can cause the cell changes on the cervix. The cells collected during the Pap test can be tested for HPV. The HPV test could be used to screen women aged 59 years and older, and should be used in women of any age who have unclear Pap test results. After the age of 67, women should have HPV testing at the same frequency as a Pap test.  Colorectal cancer can be detected and often prevented. Most routine colorectal cancer screening begins at the age of 54 years and continues through age 37 years. However, your health care provider may recommend screening at an earlier age if you have risk factors for colon cancer. On a yearly basis, your health care provider may provide home test kits to check for hidden blood in the stool. Use of a small camera at the end of a tube, to directly examine the colon  (sigmoidoscopy or colonoscopy), can detect the earliest forms of colorectal cancer. Talk to your health care provider about this at age 96, when routine screening begins. Direct exam of the colon should be repeated every 5-10 years through age 15 years, unless early forms of pre-cancerous polyps or small growths are found.  People who are at an increased risk for hepatitis B should be screened for this virus. You are considered at high risk for hepatitis B if:  You were born in a country where hepatitis B occurs often. Talk with your health care provider about which countries are considered high risk.  Your parents were born in a high-risk country and you have not received a shot to protect against hepatitis B (hepatitis B vaccine).  You have HIV or AIDS.  You use needles to inject street drugs.  You live with, or have sex with, someone who has hepatitis B.  You get hemodialysis treatment.  You take certain medicines for conditions like cancer, organ transplantation, and autoimmune conditions.  Hepatitis C blood testing is recommended for all people born from [birth year] through 1965 and any individual with known risks for hepatitis C.  Practice safe sex. Use condoms and avoid high-risk sexual practices to reduce the spread of sexually transmitted infections (STIs). STIs include gonorrhea, chlamydia, syphilis, trichomonas, herpes, HPV, and human immunodeficiency virus (HIV). Herpes, HIV, and HPV are viral illnesses that have no cure. They can result in disability, cancer, and death.  You should be screened for sexually transmitted illnesses (STIs) including gonorrhea and chlamydia if:  You are sexually active and are younger than 24 years.  You are older than 24 years and your health care provider tells you that you are at risk for this type of infection.  Your sexual activity has changed since you were last screened and you are at an increased risk for chlamydia or gonorrhea. Ask your health  care provider if you are at risk.  If you are at risk of being infected with HIV, it is recommended that you take a prescription medicine daily to prevent HIV infection. This is called preexposure prophylaxis (PrEP). You are considered at risk if:  You are a heterosexual woman, are sexually active, and are at increased risk for HIV infection.  You take drugs by injection.  You are sexually active with a partner who has HIV.  Talk with your health care provider about whether you are at high risk of being infected with HIV. If you choose to begin PrEP, you should first be  tested for HIV. You should then be tested every 3 months for as long as you are taking PrEP.  Osteoporosis is a disease in which the bones lose minerals and strength with aging. This can result in serious bone fractures or breaks. The risk of osteoporosis can be identified using a bone density scan. Women ages 64 years and over and women at risk for fractures or osteoporosis should discuss screening with their health care providers. Ask your health care provider whether you should take a calcium supplement or vitamin D to reduce the rate of osteoporosis.  Menopause can be associated with physical symptoms and risks. Hormone replacement therapy is available to decrease symptoms and risks. You should talk to your health care provider about whether hormone replacement therapy is right for you.  Use sunscreen. Apply sunscreen liberally and repeatedly throughout the day. You should seek shade when your shadow is shorter than you. Protect yourself by wearing long sleeves, pants, a wide-brimmed hat, and sunglasses year round, whenever you are outdoors.  Once a month, do a whole body skin exam, using a mirror to look at the skin on your back. Tell your health care provider of new moles, moles that have irregular borders, moles that are larger than a pencil eraser, or moles that have changed in shape or color.  Stay current with required  vaccines (immunizations).  Influenza vaccine. All adults should be immunized every year.  Tetanus, diphtheria, and acellular pertussis (Td, Tdap) vaccine. Pregnant women should receive 1 dose of Tdap vaccine during each pregnancy. The dose should be obtained regardless of the length of time since the last dose. Immunization is preferred during the 27th-36th week of gestation. An adult who has not previously received Tdap or who does not know her vaccine status should receive 1 dose of Tdap. This initial dose should be followed by tetanus and diphtheria toxoids (Td) booster doses every 10 years. Adults with an unknown or incomplete history of completing a 3-dose immunization series with Td-containing vaccines should begin or complete a primary immunization series including a Tdap dose. Adults should receive a Td booster every 10 years.  Varicella vaccine. An adult without evidence of immunity to varicella should receive 2 doses or a second dose if she has previously received 1 dose. Pregnant females who do not have evidence of immunity should receive the first dose after pregnancy. This first dose should be obtained before leaving the health care facility. The second dose should be obtained 4-8 weeks after the first dose.  Human papillomavirus (HPV) vaccine. Females aged 13-26 years who have not received the vaccine previously should obtain the 3-dose series. The vaccine is not recommended for use in pregnant females. However, pregnancy testing is not needed before receiving a dose. If a female is found to be pregnant after receiving a dose, no treatment is needed. In that case, the remaining doses should be delayed until after the pregnancy. Immunization is recommended for any person with an immunocompromised condition through the age of 45 years if she did not get any or all doses earlier. During the 3-dose series, the second dose should be obtained 4-8 weeks after the first dose. The third dose should be  obtained 24 weeks after the first dose and 16 weeks after the second dose.  Zoster vaccine. One dose is recommended for adults aged 68 years or older unless certain conditions are present.  Measles, mumps, and rubella (MMR) vaccine. Adults born before [birth year] generally are considered immune to measles and  mumps. Adults born in [birth year] or later should have 1 or more doses of MMR vaccine unless there is a contraindication to the vaccine or there is laboratory evidence of immunity to each of the three diseases. A routine second dose of MMR vaccine should be obtained at least 28 days after the first dose for students attending postsecondary schools, health care workers, or international travelers. People who received inactivated measles vaccine or an unknown type of measles vaccine during 1963-1967 should receive 2 doses of MMR vaccine. People who received inactivated mumps vaccine or an unknown type of mumps vaccine before 1979 and are at high risk for mumps infection should consider immunization with 2 doses of MMR vaccine. For females of childbearing age, rubella immunity should be determined. If there is no evidence of immunity, females who are not pregnant should be vaccinated. If there is no evidence of immunity, females who are pregnant should delay immunization until after pregnancy. Unvaccinated health care workers born before [birth year] who lack laboratory evidence of measles, mumps, or rubella immunity or laboratory confirmation of disease should consider measles and mumps immunization with 2 doses of MMR vaccine or rubella immunization with 1 dose of MMR vaccine.  Pneumococcal 13-valent conjugate (PCV13) vaccine. When indicated, a person who is uncertain of her immunization history and has no record of immunization should receive the PCV13 vaccine. An adult aged 36 years or older who has certain medical conditions and has not been previously immunized should receive 1 dose of PCV13 vaccine. This PCV13 should be  followed with a dose of pneumococcal polysaccharide (PPSV23) vaccine. The PPSV23 vaccine dose should be obtained at least 8 weeks after the dose of PCV13 vaccine. An adult aged 7 years or older who has certain medical conditions and previously received 1 or more doses of PPSV23 vaccine should receive 1 dose of PCV13. The PCV13 vaccine dose should be obtained 1 or more years after the last PPSV23 vaccine dose.  Pneumococcal polysaccharide (PPSV23) vaccine. When PCV13 is also indicated, PCV13 should be obtained first. All adults aged 28 years and older should be immunized. An adult younger than age 19 years who has certain medical conditions should be immunized. Any person who resides in a nursing home or long-term care facility should be immunized. An adult smoker should be immunized. People with an immunocompromised condition and certain other conditions should receive both PCV13 and PPSV23 vaccines. People with human immunodeficiency virus (HIV) infection should be immunized as soon as possible after diagnosis. Immunization during chemotherapy or radiation therapy should be avoided. Routine use of PPSV23 vaccine is not recommended for American Indians, [place] Natives, or people younger than 65 years unless there are medical conditions that require PPSV23 vaccine. When indicated, people who have unknown immunization and have no record of immunization should receive PPSV23 vaccine. One-time revaccination 5 years after the first dose of PPSV23 is recommended for people aged 19-64 years who have chronic kidney failure, nephrotic syndrome, asplenia, or immunocompromised conditions. People who received 1-2 doses of PPSV23 before age 66 years should receive another dose of PPSV23 vaccine at age 47 years or later if at least 5 years have passed since the previous dose. Doses of PPSV23 are not needed for people immunized with PPSV23 at or after age 39 years.  Meningococcal vaccine. Adults with asplenia or persistent  complement component deficiencies should receive 2 doses of quadrivalent meningococcal conjugate (MenACWY-D) vaccine. The doses should be obtained at least 2 months apart. Microbiologists working with certain meningococcal bacteria, [employer]  recruits, people at risk during an outbreak, and people who travel to or live in countries with a high rate of meningitis should be immunized. A first-year college student up through age 3 years who is living in a residence hall should receive a dose if she did not receive a dose on or after her 16th birthday. Adults who have certain high-risk conditions should receive one or more doses of vaccine.  Hepatitis A vaccine. Adults who wish to be protected from this disease, have certain high-risk conditions, work with hepatitis A-infected animals, work in hepatitis A research labs, or travel to or work in countries with a high rate of hepatitis A should be immunized. Adults who were previously unvaccinated and who anticipate close contact with an international adoptee during the first 60 days after arrival in the [place] from a country with a high rate of hepatitis A should be immunized.  Hepatitis B vaccine. Adults who wish to be protected from this disease, have certain high-risk conditions, may be exposed to blood or other infectious body fluids, are household contacts or sex partners of hepatitis B positive people, are clients or workers in certain care facilities, or travel to or work in countries with a high rate of hepatitis B should be immunized.  Haemophilus influenzae type b (Hib) vaccine. A previously unvaccinated person with asplenia or sickle cell disease or having a scheduled splenectomy should receive 1 dose of Hib vaccine. Regardless of previous immunization, a recipient of a hematopoietic stem cell transplant should receive a 3-dose series 6-12 months after her successful transplant. Hib vaccine is not recommended for adults with HIV  infection. Preventive Services / Frequency Ages 93 to 67 years  Blood pressure check.** / Every 1 to 2 years.  Lipid and cholesterol check.** / Every 5 years beginning at age 50.  Clinical breast exam.** / Every 3 years for women in their 83s and 89s.  BRCA-related cancer risk assessment.** / For women who have family members with a BRCA-related cancer (breast, ovarian, tubal, or peritoneal cancers).  Pap test.** / Every 2 years from ages 64 through 35. Every 3 years starting at age 61 through age 96 or 73 with a history of 3 consecutive normal Pap tests.  HPV screening.** / Every 3 years from ages 94 through ages 65 to 82 with a history of 3 consecutive normal Pap tests.  Hepatitis C blood test.** / For any individual with known risks for hepatitis C.  Skin self-exam. / Monthly.  Influenza vaccine. / Every year.  Tetanus, diphtheria, and acellular pertussis (Tdap, Td) vaccine.** / Consult your health care provider. Pregnant women should receive 1 dose of Tdap vaccine during each pregnancy. 1 dose of Td every 10 years.  Varicella vaccine.** / Consult your health care provider. Pregnant females who do not have evidence of immunity should receive the first dose after pregnancy.  HPV vaccine. / 3 doses over 6 months, if 43 and younger. The vaccine is not recommended for use in pregnant females. However, pregnancy testing is not needed before receiving a dose.  Measles, mumps, rubella (MMR) vaccine.** / You need at least 1 dose of MMR if you were born in 1957 or later. You may also need a 2nd dose. For females of childbearing age, rubella immunity should be determined. If there is no evidence of immunity, females who are not pregnant should be vaccinated. If there is no evidence of immunity, females who are pregnant should delay immunization until after pregnancy.  Pneumococcal 13-valent  conjugate (PCV13) vaccine.** / Consult your health care provider.  Pneumococcal polysaccharide  (PPSV23) vaccine.** / 1 to 2 doses if you smoke cigarettes or if you have certain conditions.  Meningococcal vaccine.** / 1 dose if you are age 35 to 59 years and a Market researcher living in a residence hall, or have one of several medical conditions, you need to get vaccinated against meningococcal disease. You may also need additional booster doses.  Hepatitis A vaccine.** / Consult your health care provider.  Hepatitis B vaccine.** / Consult your health care provider.  Haemophilus influenzae type b (Hib) vaccine.** / Consult your health care provider. Ages 3 to 26 years  Blood pressure check.** / Every 1 to 2 years.  Lipid and cholesterol check.** / Every 5 years beginning at age 8 years.  Lung cancer screening. / Every year if you are aged 110-80 years and have a 30-pack-year history of smoking and currently smoke or have quit within the past 15 years. Yearly screening is stopped once you have quit smoking for at least 15 years or develop a health problem that would prevent you from having lung cancer treatment.  Clinical breast exam.** / Every year after age 26 years.  BRCA-related cancer risk assessment.** / For women who have family members with a BRCA-related cancer (breast, ovarian, tubal, or peritoneal cancers).  Mammogram.** / Every year beginning at age 5 years and continuing for as long as you are in good health. Consult with your health care provider.  Pap test.** / Every 3 years starting at age 20 years through age 90 or 76 years with a history of 3 consecutive normal Pap tests.  HPV screening.** / Every 3 years from ages 31 years through ages 11 to 40 years with a history of 3 consecutive normal Pap tests.  Fecal occult blood test (FOBT) of stool. / Every year beginning at age 30 years and continuing until age 2 years. You may not need to do this test if you get a colonoscopy every 10 years.  Flexible sigmoidoscopy or colonoscopy.** / Every 5 years for a  flexible sigmoidoscopy or every 10 years for a colonoscopy beginning at age 74 years and continuing until age 32 years.  Hepatitis C blood test.** / For all people born from [birth year] through 1965 and any individual with known risks for hepatitis C.  Skin self-exam. / Monthly.  Influenza vaccine. / Every year.  Tetanus, diphtheria, and acellular pertussis (Tdap/Td) vaccine.** / Consult your health care provider. Pregnant women should receive 1 dose of Tdap vaccine during each pregnancy. 1 dose of Td every 10 years.  Varicella vaccine.** / Consult your health care provider. Pregnant females who do not have evidence of immunity should receive the first dose after pregnancy.  Zoster vaccine.** / 1 dose for adults aged 104 years or older.  Measles, mumps, rubella (MMR) vaccine.** / You need at least 1 dose of MMR if you were born in 1957 or later. You may also need a 2nd dose. For females of childbearing age, rubella immunity should be determined. If there is no evidence of immunity, females who are not pregnant should be vaccinated. If there is no evidence of immunity, females who are pregnant should delay immunization until after pregnancy.  Pneumococcal 13-valent conjugate (PCV13) vaccine.** / Consult your health care provider.  Pneumococcal polysaccharide (PPSV23) vaccine.** / 1 to 2 doses if you smoke cigarettes or if you have certain conditions.  Meningococcal vaccine.** / Consult your health care provider.  Hepatitis  A vaccine.** / Consult your health care provider.  Hepatitis B vaccine.** / Consult your health care provider.  Haemophilus influenzae type b (Hib) vaccine.** / Consult your health care provider. Ages 7 years and over  Blood pressure check.** / Every 1 to 2 years.  Lipid and cholesterol check.** / Every 5 years beginning at age 46 years.  Lung cancer screening. / Every year if you are aged 38-80 years and have a 30-pack-year history of smoking and currently smoke or have  quit within the past 15 years. Yearly screening is stopped once you have quit smoking for at least 15 years or develop a health problem that would prevent you from having lung cancer treatment.  Clinical breast exam.** / Every year after age 13 years.  BRCA-related cancer risk assessment.** / For women who have family members with a BRCA-related cancer (breast, ovarian, tubal, or peritoneal cancers).  Mammogram.** / Every year beginning at age 35 years and continuing for as long as you are in good health. Consult with your health care provider.  Pap test.** / Every 3 years starting at age 63 years through age 85 or 17 years with 3 consecutive normal Pap tests. Testing can be stopped between 65 and 70 years with 3 consecutive normal Pap tests and no abnormal Pap or HPV tests in the past 10 years.  HPV screening.** / Every 3 years from ages 58 years through ages 9 or 68 years with a history of 3 consecutive normal Pap tests. Testing can be stopped between 65 and 70 years with 3 consecutive normal Pap tests and no abnormal Pap or HPV tests in the past 10 years.  Fecal occult blood test (FOBT) of stool. / Every year beginning at age 35 years and continuing until age 33 years. You may not need to do this test if you get a colonoscopy every 10 years.  Flexible sigmoidoscopy or colonoscopy.** / Every 5 years for a flexible sigmoidoscopy or every 10 years for a colonoscopy beginning at age 81 years and continuing until age 37 years.  Hepatitis C blood test.** / For all people born from [birth year] through 1965 and any individual with known risks for hepatitis C.  Osteoporosis screening.** / A one-time screening for women ages 90 years and over and women at risk for fractures or osteoporosis.  Skin self-exam. / Monthly.  Influenza vaccine. / Every year.  Tetanus, diphtheria, and acellular pertussis (Tdap/Td) vaccine.** / 1 dose of Td every 10 years.  Varicella vaccine.** / Consult your health care  provider.  Zoster vaccine.** / 1 dose for adults aged 64 years or older.  Pneumococcal 13-valent conjugate (PCV13) vaccine.** / Consult your health care provider.  Pneumococcal polysaccharide (PPSV23) vaccine.** / 1 dose for all adults aged 13 years and older.  Meningococcal vaccine.** / Consult your health care provider.  Hepatitis A vaccine.** / Consult your health care provider.  Hepatitis B vaccine.** / Consult your health care provider.  Haemophilus influenzae type b (Hib) vaccine.** / Consult your health care provider. ** Family history and personal history of risk and conditions may change your health care provider's recommendations. Document Released: 07/28/2001 Document Revised: 10/16/2013 Document Reviewed: 10/27/2010 [facility] Patient Information 2015 [place], [place]. This information is not intended to replace advice given to you by your health care provider. Make sure you discuss any questions you have with your health care provider.

## 2014-05-16 NOTE — Assessment & Plan Note (Signed)
I have reviewed the patient's medical history in detail and updated the computerized patient record.  Flu shot up-to-date. TDaP given today.  Mammogram and PAP up-to-date. Will obtain fasting labs at today's visit. Preventive healthcare discussed at visit.  Handout given.

## 2014-05-16 NOTE — Progress Notes (Signed)
Patient presents to clinic today for annual exam.  Patient is fasting for labs.  Acute Concerns: No acute concerns at today's visit.  Health Maintenance: Dental -- up-to-date Vision -- up-to-date Immunizations -- Has had flu shot 02/2014.  Will update Tetanus today. Mammogram -- Endorses last mammogram April 2015.  No abnormal findings. PAP -- up-to-date.  Past Medical History  Diagnosis Date  . Preventative health care 09/16/2012  . Insomnia 09/16/2012  . Decreased libido 09/16/2012  . Acne 09/16/2012  . Sun-damaged skin 09/16/2012    Past Surgical History  Procedure Laterality Date  . Anterior cruciate ligament repair  01-2004    right leg - replacement  . Wisdom tooth extraction  1991    all 4  . Skin biopsy      multiple one with 17 stitches, 1 precancer lesion    Current Outpatient Prescriptions on File Prior to Visit  Medication Sig Dispense Refill  . Norgestimate-Ethinyl Estradiol Triphasic (ORTHO TRI-CYCLEN LO) 0.18/0.215/0.25 MG-25 MCG tab Take 1 tablet by mouth daily. Start the first Sunday after next cycle 1 Package 5   No current facility-administered medications on file prior to visit.    No Known Allergies  Family History  Problem Relation Age of Onset  . Arthritis Mother     hands  . Cancer Maternal Grandmother     melanoma  . Cancer Maternal Grandfather 1383    kidney  . Heart attack Maternal Grandfather   . Alcohol abuse Maternal Grandfather   . Heart disease Maternal Grandfather   . Stroke Maternal Grandfather   . Cancer Paternal Grandmother     melanoma  . Cancer Paternal Grandfather     lung- smoker    History   Social History  . Marital Status: Married    Spouse Name: N/A    Number of Children: N/A  . Years of Education: N/A   Occupational History  . Not on file.   Social History Main Topics  . Smoking status: Never Smoker   . Smokeless tobacco: Never Used  . Alcohol Use: No  . Drug Use: No  . Sexual Activity:    Partners: Male     Other Topics Concern  . Not on file   Social History Narrative   Review of Systems  Constitutional: Negative for fever and weight loss.  HENT: Negative for ear discharge, ear pain, hearing loss and tinnitus.   Eyes: Negative for blurred vision, double vision, photophobia and pain.  Respiratory: Negative for shortness of breath.   Cardiovascular: Negative for chest pain and palpitations.  Gastrointestinal: Negative for heartburn, nausea, vomiting, abdominal pain, diarrhea, constipation, blood in stool and melena.  Genitourinary: Negative for dysuria, urgency, frequency, hematuria and flank pain.  Neurological: Negative for dizziness, loss of consciousness and headaches.  Psychiatric/Behavioral: Negative for depression, suicidal ideas, hallucinations and substance abuse. The patient is not nervous/anxious and does not have insomnia.    BP 120/82 mmHg  Pulse 68  Temp(Src) 97.9 F (36.6 C) (Oral)  Resp 16  Ht 5' (1.524 m)  Wt 123 lb 2 oz (55.849 kg)  BMI 24.05 kg/m2  SpO2 100%  LMP 04/25/2014  Physical Exam  Constitutional: She is oriented to person, place, and time and well-developed, well-nourished, and in no distress.  HENT:  Head: Normocephalic and atraumatic.  Right Ear: External ear normal.  Left Ear: External ear normal.  Nose: Nose normal.  Mouth/Throat: Oropharynx is clear and moist. No oropharyngeal exudate.  TM within normal limits bilaterally.  Eyes: Conjunctivae and EOM are normal. Pupils are equal, round, and reactive to light.  Neck: Neck supple. No thyromegaly present.  Cardiovascular: Normal rate, regular rhythm, normal heart sounds and intact distal pulses.   Pulmonary/Chest: Effort normal and breath sounds normal. No respiratory distress. She has no wheezes. She has no rales. She exhibits no tenderness.  Abdominal: Soft. Bowel sounds are normal. She exhibits no distension and no mass. There is no tenderness. There is no rebound and no guarding.   Lymphadenopathy:    She has no cervical adenopathy.  Neurological: She is alert and oriented to person, place, and time. No cranial nerve deficit.  Skin: Skin is warm and dry. No rash noted.  Psychiatric: Affect normal.  Vitals reviewed.  Assessment/Plan: Visit for preventive health examination I have reviewed the patient's medical history in detail and updated the computerized patient record.  Flu shot up-to-date. TDaP given today.  Mammogram and PAP up-to-date. Will obtain fasting labs at today's visit. Preventive healthcare discussed at visit.  Handout given.

## 2014-05-17 LAB — NICOTINE/COTININE METABOLITES

## 2014-06-04 ENCOUNTER — Encounter: Payer: Self-pay | Admitting: Family Medicine

## 2014-06-05 ENCOUNTER — Telehealth: Payer: Self-pay | Admitting: Family Medicine

## 2014-06-05 NOTE — Telephone Encounter (Signed)
I am not sure what they are referring to her labs were too> did not run a straight iron study but did run a CBC and that was completely normal. Please let her know. No concerns identified

## 2014-06-05 NOTE — Telephone Encounter (Signed)
Caller name:Lindor Janiyah Relation to ZH:YQMVpt:self Call back [phone] Pharmacy:  Reason for call: pt is wanting to speak with you regarding her lab results, states she reviewed her results on my chart and states  [employer] sent her a summary report and her blood iron level is very high and she wants to know if she should be concerned,  Labs were done 3 weeks ago

## 2014-06-06 NOTE — Telephone Encounter (Signed)
LM for patient to return the call.  

## 2014-06-06 NOTE — Telephone Encounter (Signed)
Call Documentation      Wende MottGaye H Foster, RN at 06/06/2014 12:00 PM     Status: Signed       Expand All Collapse All   Spoke with patient and reviewed per recommendations. Patient will send the results that she was given so that we will have a comparison.             Dedra SkeensStephanie S Stevens at 06/06/2014 10:33 AM     Status: Signed       Expand All Collapse All   Patient returned phone call. Best # [phone]            Wende MottGaye H Foster, RN at 06/06/2014 9:50 AM     Status: Signed       Expand All Collapse All   LM for patient to return the call.            Bradd CanaryStacey A Blyth, MD at 06/05/2014 7:56 PM     Status: Signed       Expand All Collapse All   I am not sure what they are referring to her labs were too> did not run a straight iron study but did run a CBC and that was completely normal. Please let her know. No concerns identified            Alleen BorneGwendolyn Davis at 06/05/2014 10:13 AM     Status: Signed       Expand All Collapse All   Caller name:Ocallaghan Seleny Relation to UJ:WJXBpt:self Call back [phone] Pharmacy:  Reason for call: pt is wanting to speak with you regarding her lab results, states she reviewed her results on my chart and states [employer] sent her a summary report and her blood iron level is very high and she wants to know if she should be concerned, Labs were done 3 weeks ago

## 2014-06-06 NOTE — Telephone Encounter (Signed)
Patient returned phone call. Best # [phone]

## 2014-06-06 NOTE — Telephone Encounter (Signed)
Spoke with patient and reviewed per recommendations. Patient will send the results that she was given so that we will have a comparison.

## 2014-10-15 ENCOUNTER — Other Ambulatory Visit: Payer: Self-pay

## 2014-10-16 LAB — CYTOLOGY - PAP

## 2015-01-28 ENCOUNTER — Other Ambulatory Visit: Payer: Self-pay | Admitting: Obstetrics and Gynecology

## 2015-01-28 DIAGNOSIS — N6315 Unspecified lump in the right breast, overlapping quadrants: Secondary | ICD-10-CM

## 2015-01-28 DIAGNOSIS — N6310 Unspecified lump in the right breast, unspecified quadrant: Principal | ICD-10-CM

## 2015-02-04 ENCOUNTER — Ambulatory Visit
Admission: RE | Admit: 2015-02-04 | Discharge: 2015-02-04 | Disposition: A | Discharge status: Home / Self Care | Payer: Managed Care, Other (non HMO) | Source: Ambulatory Visit | Attending: Obstetrics and Gynecology | Admitting: Obstetrics and Gynecology

## 2015-02-04 DIAGNOSIS — N6310 Unspecified lump in the right breast, unspecified quadrant: Principal | ICD-10-CM

## 2015-02-04 DIAGNOSIS — N6315 Unspecified lump in the right breast, overlapping quadrants: Secondary | ICD-10-CM

## 2015-03-04 ENCOUNTER — Encounter: Payer: Managed Care, Other (non HMO) | Admitting: Physician Assistant

## 2015-03-11 ENCOUNTER — Telehealth: Payer: Self-pay | Admitting: *Deleted

## 2015-03-11 NOTE — Telephone Encounter (Signed)
Unable to reach patient at time of Pre-Visit Call.  Left message for patient to return call when available.    

## 2015-03-12 ENCOUNTER — Encounter: Payer: Managed Care, Other (non HMO) | Admitting: Physician Assistant

## 2015-05-28 ENCOUNTER — Telehealth: Payer: Self-pay | Admitting: Family Medicine

## 2015-05-28 ENCOUNTER — Ambulatory Visit (HOSPITAL_BASED_OUTPATIENT_CLINIC_OR_DEPARTMENT_OTHER)
Admission: RE | Admit: 2015-05-28 | Discharge: 2015-05-28 | Disposition: A | Discharge status: Home / Self Care | Payer: Managed Care, Other (non HMO) | Source: Ambulatory Visit | Attending: Family Medicine | Admitting: Family Medicine

## 2015-05-28 ENCOUNTER — Encounter: Payer: Managed Care, Other (non HMO) | Admitting: Physician Assistant

## 2015-05-28 ENCOUNTER — Ambulatory Visit (INDEPENDENT_AMBULATORY_CARE_PROVIDER_SITE_OTHER): Payer: Managed Care, Other (non HMO) | Admitting: Family Medicine

## 2015-05-28 ENCOUNTER — Encounter: Payer: Self-pay | Admitting: Family Medicine

## 2015-05-28 VITALS — BP 122/90 | HR 115 | Temp 101.7°F | Resp 20 | Wt 111.5 lb

## 2015-05-28 DIAGNOSIS — R059 Cough, unspecified: Secondary | ICD-10-CM

## 2015-05-28 DIAGNOSIS — R509 Fever, unspecified: Secondary | ICD-10-CM | POA: Insufficient documentation

## 2015-05-28 DIAGNOSIS — O864 Pyrexia of unknown origin following delivery: Secondary | ICD-10-CM | POA: Diagnosis not present

## 2015-05-28 DIAGNOSIS — J029 Acute pharyngitis, unspecified: Secondary | ICD-10-CM

## 2015-05-28 DIAGNOSIS — R0602 Shortness of breath: Secondary | ICD-10-CM | POA: Insufficient documentation

## 2015-05-28 DIAGNOSIS — R918 Other nonspecific abnormal finding of lung field: Secondary | ICD-10-CM | POA: Insufficient documentation

## 2015-05-28 DIAGNOSIS — R0989 Other specified symptoms and signs involving the circulatory and respiratory systems: Secondary | ICD-10-CM | POA: Insufficient documentation

## 2015-05-28 DIAGNOSIS — R05 Cough: Secondary | ICD-10-CM | POA: Insufficient documentation

## 2015-05-28 LAB — POCT INFLUENZA A/B
Influenza A, POC: NEGATIVE
Influenza B, POC: NEGATIVE

## 2015-05-28 LAB — POCT RAPID STREP A (OFFICE): RAPID STREP A SCREEN: NEGATIVE

## 2015-05-28 MED ORDER — HYDROCODONE-HOMATROPINE 5-1.5 MG/5ML PO SYRP: 5.0000 mL | ORAL_SOLUTION | Freq: Three times a day (TID) | ORAL | Status: DC | PRN

## 2015-05-28 MED ORDER — PREDNISONE 50 MG PO TABS: 50.0000 mg | ORAL_TABLET | Freq: Every day | ORAL | Status: DC

## 2015-05-28 MED ORDER — AZITHROMYCIN 250 MG PO TABS: ORAL_TABLET | ORAL | Status: DC

## 2015-05-28 NOTE — Patient Instructions (Addendum)
Community-Acquired Pneumonia, Adult Pneumonia is an infection of the lungs. There are different types of pneumonia. One type can develop while a person is in a hospital. A different type, called community-acquired pneumonia, develops in people who are not, or have not recently been, in the hospital or other health care facility.  CAUSES Pneumonia may be caused by bacteria, viruses, or funguses. Community-acquired pneumonia is often caused by Streptococcus pneumonia bacteria. These bacteria are often passed from one person to another by breathing in droplets from the cough or sneeze of an infected person. RISK FACTORS The condition is more likely to develop in:  People who havechronic diseases, such as chronic obstructive pulmonary disease (COPD), asthma, congestive heart failure, cystic fibrosis, diabetes, or kidney disease.  People who haveearly-stage or late-stage HIV.  People who havesickle cell disease.  People who havehad their spleen removed (splenectomy).  People who havepoor Human resources officer.  People who havemedical conditions that increase the risk of breathing in (aspirating) secretions their own mouth and nose.   People who havea weakened immune system (immunocompromised).  People who smoke.  People whotravel to areas where pneumonia-causing germs commonly exist.  People whoare around animal habitats or animals that have pneumonia-causing germs, including birds, bats, rabbits, cats, and farm animals. SYMPTOMS Symptoms of this condition include:  Adry cough.  A wet (productive) cough.  Fever.  Sweating.  Chest pain, especially when breathing deeply or coughing.  Rapid breathing or difficulty breathing.  Shortness of breath.  Shaking chills.  Fatigue.  Muscle aches. DIAGNOSIS Your health care provider will take a medical history and perform a physical exam. You may also have other tests, including:  Imaging studies of your chest, including  X-rays.  Tests to check your blood oxygen level and other blood gases.  Other tests on blood, mucus (sputum), fluid around your lungs (pleural fluid), and urine. If your pneumonia is severe, other tests may be done to identify the specific cause of your illness. TREATMENT The type of treatment that you receive depends on many factors, such as the cause of your pneumonia, the medicines you take, and other medical conditions that you have. For most adults, treatment and recovery from pneumonia may occur at home. In some cases, treatment must happen in a hospital. Treatment may include:  Antibiotic medicines, if the pneumonia was caused by bacteria.  Antiviral medicines, if the pneumonia was caused by a virus.  Medicines that are given by mouth or through an IV tube.  Oxygen.  Respiratory therapy. Although rare, treating severe pneumonia may include:  Mechanical ventilation. This is done if you are not breathing well on your own and you cannot maintain a safe blood oxygen level.  Thoracentesis. This procedureremoves fluid around one lung or both lungs to help you breathe better. HOME CARE INSTRUCTIONS  Take over-the-counter and prescription medicines only as told by your health care provider.  Only takecough medicine if you are losing sleep. Understand that cough medicine can prevent your body's natural ability to remove mucus from your lungs.  If you were prescribed an antibiotic medicine, take it as told by your health care provider. Do not stop taking the antibiotic even if you start to feel better.  Sleep in a semi-upright position at night. Try sleeping in a reclining chair, or place a few pillows under your head.  Do not use tobacco products, including cigarettes, chewing tobacco, and e-cigarettes. If you need help quitting, ask your health care provider.  Drink enough water to keep your urine  clear or pale yellow. This will help to thin out mucus secretions in your  lungs. PREVENTION There are ways that you can decrease your risk of developing community-acquired pneumonia. Consider getting a pneumococcal vaccine if:  You are older than 44 years of age.  You are older than 44 years of age and are undergoing cancer treatment, have chronic lung disease, or have other medical conditions that affect your immune system. Ask your health care provider if this applies to you. There are different types and schedules of pneumococcal vaccines. Ask your health care provider which vaccination option is best for you. You may also prevent community-acquired pneumonia if you take these actions:  Get an influenza vaccine every year. Ask your health care provider which type of influenza vaccine is best for you.  Go to the dentist on a regular basis.  Wash your hands often. Use hand sanitizer if soap and water are not available. SEEK MEDICAL CARE IF:  You have a fever.  You are losing sleep because you cannot control your cough with cough medicine. SEEK IMMEDIATE MEDICAL CARE IF:  You have worsening shortness of breath.  You have increased chest pain.  Your sickness becomes worse, especially if you are an older adult or have a weakened immune system.  You cough up blood.   This information is not intended to replace advice given to you by your health care provider. Make sure you discuss any questions you have with your health care provider.   Document Released: 06/01/2005 Document Revised: 02/20/2015 Document Reviewed: 09/26/2014 Elsevier Interactive Patient Education [employer]2016 [employer] Inc.   I am concerend you have pneumonia. Your flu and strep test are negative.  I would like to rest, stay hydrated, make certain not getting dehydrated. Use mucinex.  I have called in a cough syrup for nights only, that should help you rest.

## 2015-05-28 NOTE — Progress Notes (Signed)
   Subjective:    Patient ID: Anna May, female    DOB: 03-29-1971, 44 y.o.   MRN: [id number]  HPI  Cough/fever: Pt presents for an acute OV with complaints of headache, rhinorrhea, body aches, productive cough, ear pressure, fever (101.30F) and sore throat since Saturday (4 days ago) . Pt denies any sick contacts, nausea, vomit, diarrhea or rash. She reports no sleep secondary to cough through the night. Her chest sore from coughing and can not take a deep breath with discomfort and cough. Pt did have her flu shot this year. Pt has has Pneumonia in the past.   Past Medical History  Diagnosis Date  . Preventative health care 09/16/2012  . Insomnia 09/16/2012  . Decreased libido 09/16/2012  . Acne 09/16/2012  . Sun-damaged skin 09/16/2012   No Known Allergies  Review of Systems Negative, with the exception of above mentioned in HPI    Objective:   Physical Exam BP 122/90 mmHg  Pulse 115  Temp(Src) 101.7 F (38.7 C) (Oral)  Resp 20  Wt 111 lb 8 oz (50.576 kg)  SpO2 97%  LMP 05/20/2015 Gen: febrile. No acute distress. Fatigued, but nontoxic in appearance.  HENT: AT. [place]. Bilateral TM visualized shiny/full/pink, no erythema or bulging. MMM, no oral lesions. Bilateral nares with mild erythema, no swelling. Throat with moderate erythema, no exudates. Cough present on exam. Hoarseness present on exam. Not TTP facial sinus. Difficult to take deep breaths without harsh cough.  Eyes:Pupils Equal Round Reactive to light, Extraocular movements intact,  Conjunctiva without redness, discharge or icterus. Neck/lymp/endocrine: Supple,bilateral cervical anterior lymphadenopathy CV: tachycardic, No edema, +2/4 P posterior tibialis pulses Chest: Diminished lung sounds secondary to effort. Cough on full effort. Crackles RLL. No wheeze.  Abd: Soft. flat. NTND. BS present. No Masses palpated.  Skin: No rashes, purpura or petechiae.  Neuro: Normal gait. PERLA. EOMi. Alert. Oriented x3    Assessment &  Plan:  1. Sore throat - POCT rapid strep A--> negative - nasal flu swab --> negative  2. Cough/fever/fatigue - will treat as pneumonia with azith, mucinex, rest, hydrate, advil/tylenol. Precautions discussed, cxr ordered. If positive for pneumonia will want to follow up prior to weekend. If worsening symptoms would need to see prior or be seen in urgent care/ED - DG Chest 2 View; Future - HYDROcodone-homatropine (HYCODAN) 5-1.5 MG/5ML syrup; Take 5 mLs by mouth every 8 (eight) hours as needed for cough.  Dispense: 120 mL; Refill: 0 - azithromycin (ZITHROMAX Z-PAK) 250 MG tablet; 500 mg day 1, then 250 mg a day  Dispense: 6 each; Refill: 0   F/u 3 days

## 2015-05-28 NOTE — Telephone Encounter (Signed)
Please call patient, she did not have any signs of pneumonia on her chest x-ray. I will continue to treat her as we discussed in the office. I have also called her in prednisone in addition to the antibiotics. No improvement in one week, she is to follow-up.

## 2015-05-29 NOTE — Telephone Encounter (Signed)
Spoke with patient reviewed xray results and instructions with patient. 

## 2015-12-30 IMAGING — MG MM DIAG BREAST TOMO BILATERAL
8 series · 9 of 24 positions shown · non-contrast
Comparison: 09/22/2013 and earlier

CLINICAL DATA: Patient recently palpated a mass in the lower inner
quadrant of the right breast. Mass has decreased in size
significantly.

EXAM:
DIGITAL DIAGNOSTIC BILATERAL MAMMOGRAM WITH 3D TOMOSYNTHESIS WITH
CAD
ULTRASOUND RIGHT BREAST

[L CC]
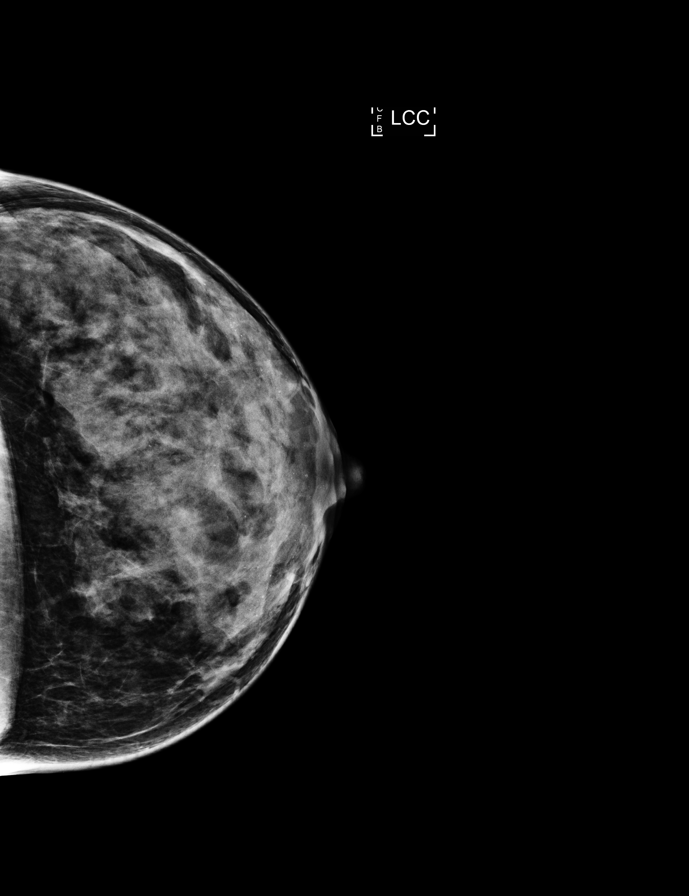

[R CC]
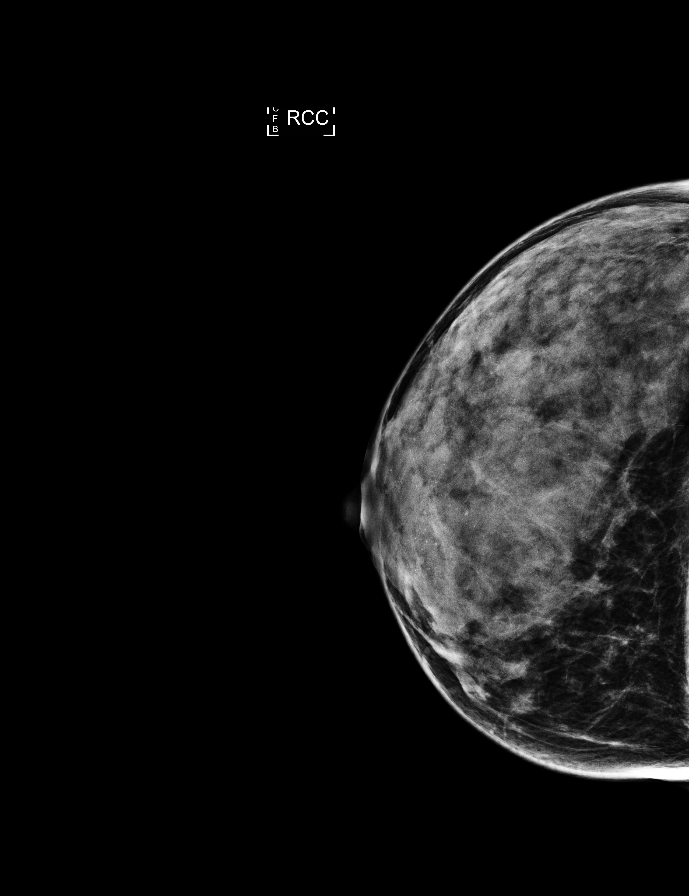

[R MLO]
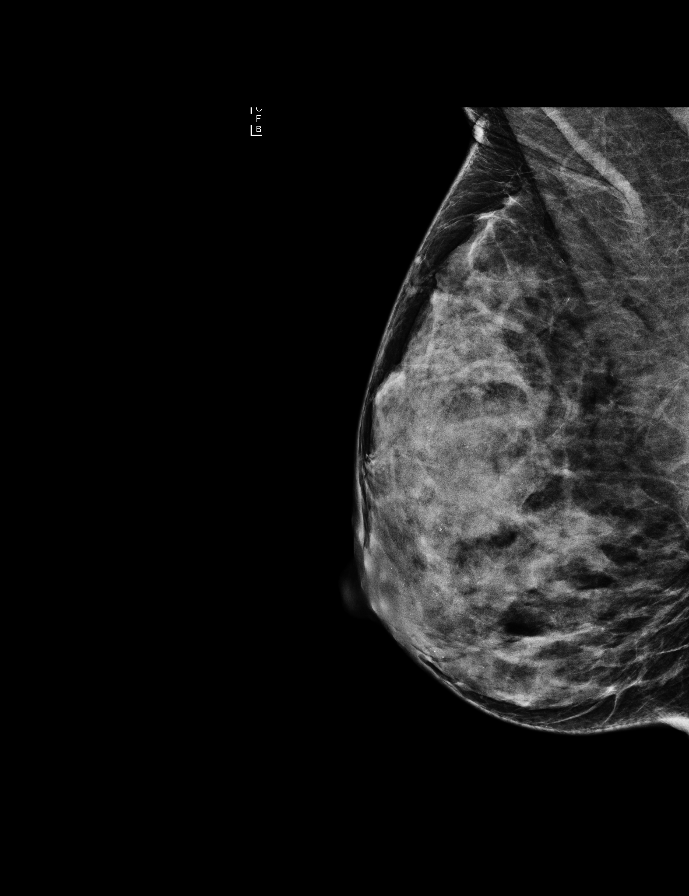

[L MLO]
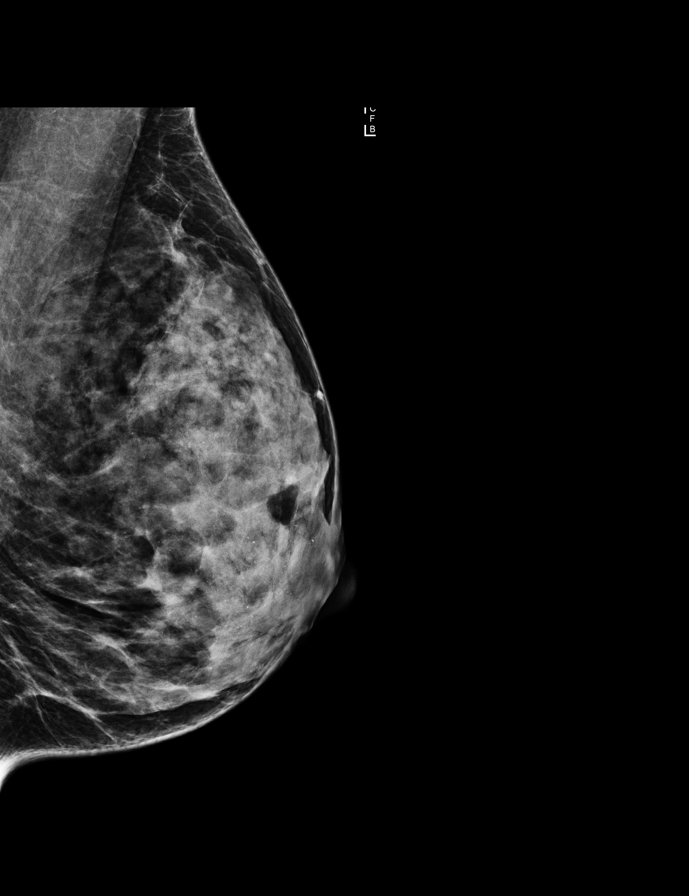

[R MLO tomo · 2 of 46 frames shown]
[frame 15/46]
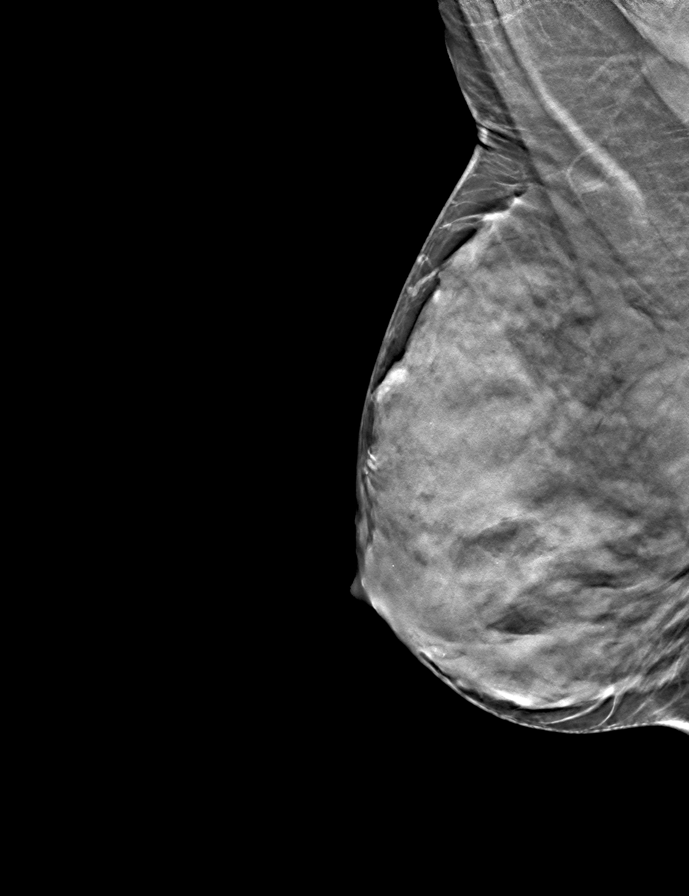
[frame 23/46]
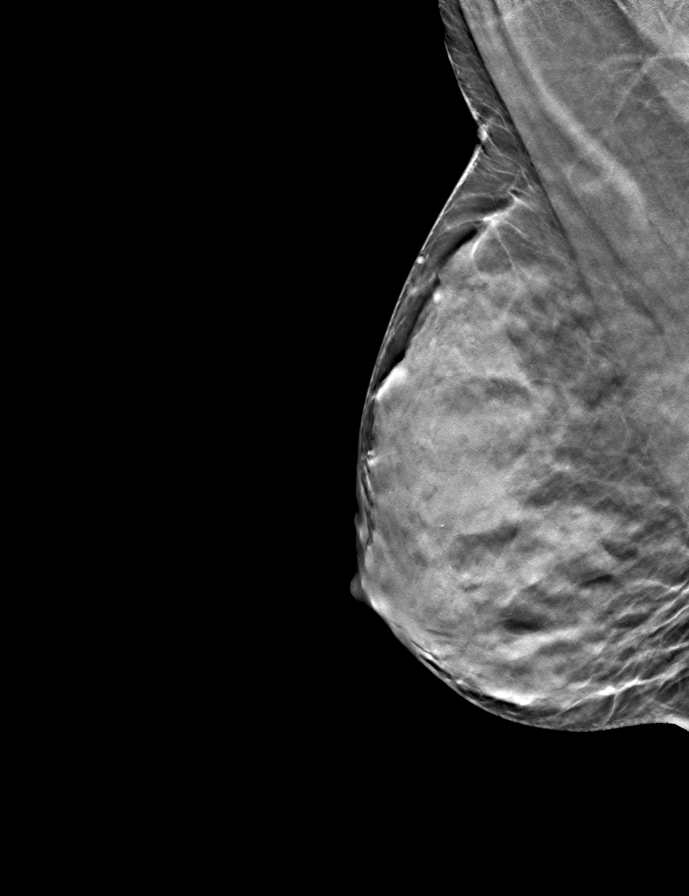

[L MLO tomo · tomo slice 23/46.0]
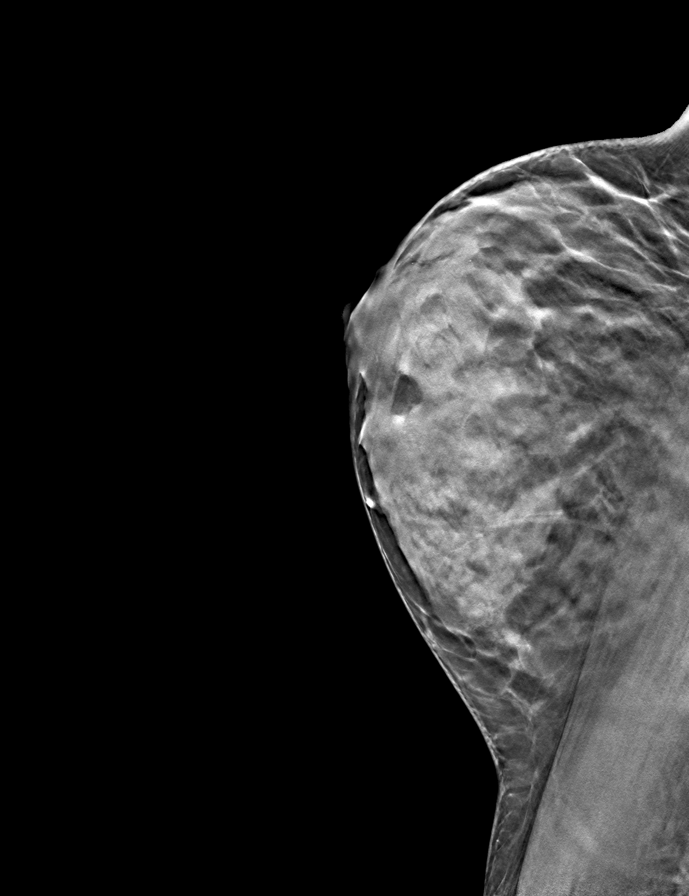

[L CC tomo · tomo slice 24/47.0]
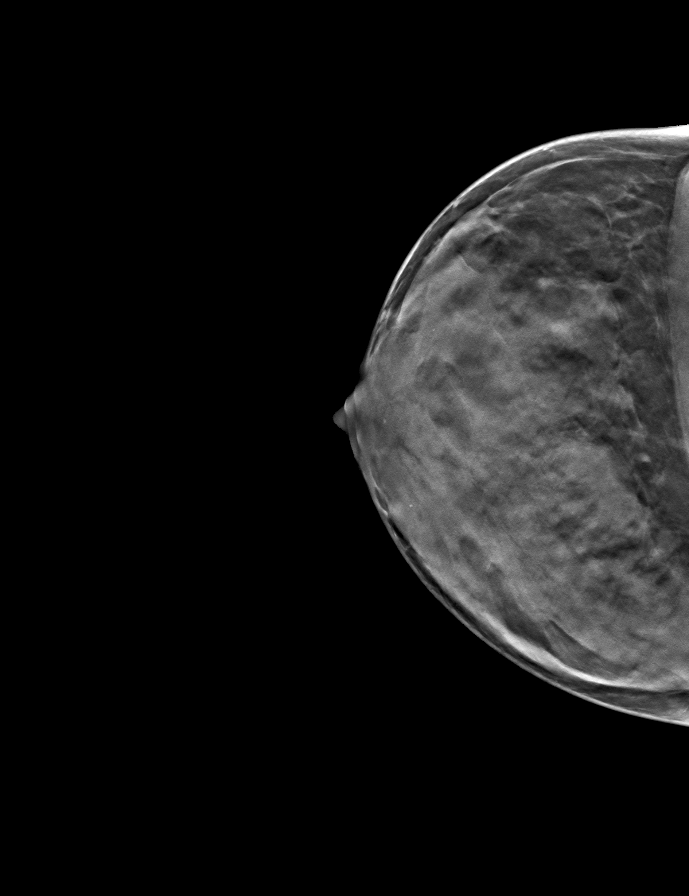

[R CC tomo · tomo slice 23/44.0]
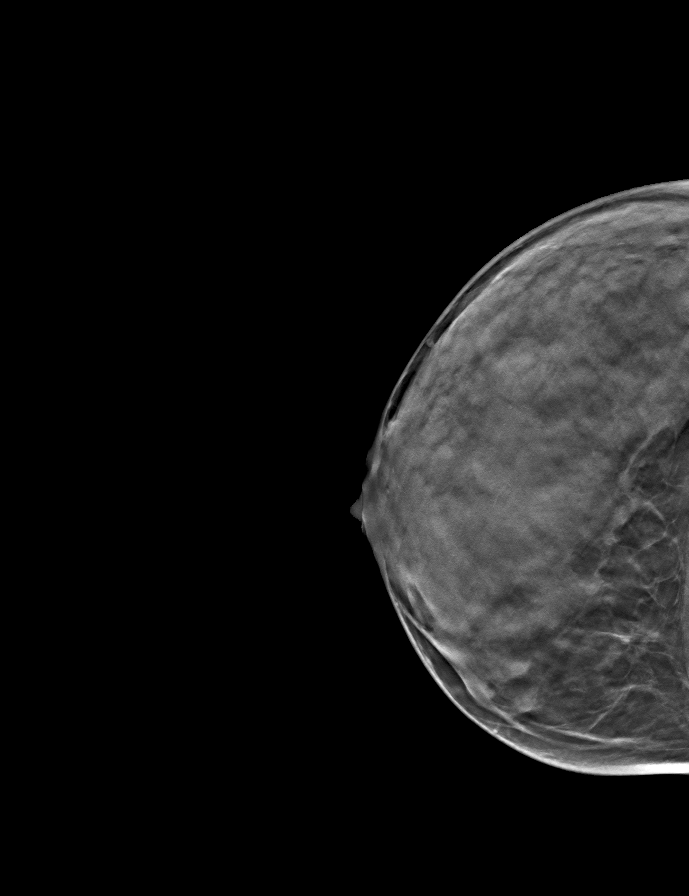

[9 of 24 positions shown; findings below may reference images not displayed]

ACR Breast Density Category d: The breast tissue is extremely dense,
which lowers the sensitivity of mammography.
FINDINGS: No suspicious mass, distortion, or microcalcifications are
identified to suggest presence of malignancy.

Mammographic images were processed with CAD.

On physical exam, I palpate no discrete mass in the medial aspect of
the right breast in the area of patient's concern.

Targeted ultrasound is performed, showing normal appearing dense
fibroglandular tissue in the medial quadrants of the right breast.
No mass, distortion, or acoustic shadowing is demonstrated with
ultrasound.
IMPRESSION: No mammographic or ultrasound evidence for malignancy.

RECOMMENDATION:
Screening mammogram in one year.(Code:Q1-3-SBF)

I have discussed the findings and recommendations with the patient.
Results were also provided in writing at the conclusion of the
visit. If applicable, a reminder letter will be sent to the patient
regarding the next appointment.

BI-RADS CATEGORY  1: Negative.

## 2016-03-17 ENCOUNTER — Other Ambulatory Visit: Payer: Self-pay | Admitting: Family Medicine

## 2016-03-27 ENCOUNTER — Ambulatory Visit (INDEPENDENT_AMBULATORY_CARE_PROVIDER_SITE_OTHER): Payer: Managed Care, Other (non HMO) | Admitting: Family Medicine

## 2016-03-27 ENCOUNTER — Encounter: Payer: Self-pay | Admitting: Family Medicine

## 2016-03-27 VITALS — BP 115/86 | HR 82 | Temp 98.3°F | Resp 20 | Ht 60.0 in | Wt 123.5 lb

## 2016-03-27 DIAGNOSIS — Z1329 Encounter for screening for other suspected endocrine disorder: Secondary | ICD-10-CM | POA: Diagnosis not present

## 2016-03-27 DIAGNOSIS — Z131 Encounter for screening for diabetes mellitus: Secondary | ICD-10-CM

## 2016-03-27 DIAGNOSIS — Z0000 Encounter for general adult medical examination without abnormal findings: Secondary | ICD-10-CM | POA: Diagnosis not present

## 2016-03-27 DIAGNOSIS — Z79899 Other long term (current) drug therapy: Secondary | ICD-10-CM | POA: Diagnosis not present

## 2016-03-27 DIAGNOSIS — Z13220 Encounter for screening for lipoid disorders: Secondary | ICD-10-CM

## 2016-03-27 DIAGNOSIS — F411 Generalized anxiety disorder: Secondary | ICD-10-CM

## 2016-03-27 DIAGNOSIS — F902 Attention-deficit hyperactivity disorder, combined type: Secondary | ICD-10-CM

## 2016-03-27 DIAGNOSIS — Z130 Encounter for screening for diseases of the blood and blood-forming organs and certain disorders involving the immune mechanism: Secondary | ICD-10-CM

## 2016-03-27 NOTE — Progress Notes (Addendum)
Patient ID: Anna May, female  DOB: Jul 19, 1970, 45 y.o.   MRN: [id number] Patient Care Team    Relationship Specialty Notifications Start End  Ma Hillock, DO PCP - General Family Medicine  03/27/16   Kennon Holter, NP Nurse Practitioner Nurse Practitioner  03/27/16    Comment: GYN NP for Anna Nations, MD Consulting Physician Orthopedic Surgery  03/27/16   Rigoberto Noel, MD Consulting Physician Pulmonary Disease  03/27/16   Rolm Bookbinder, MD Consulting Physician Dermatology  03/27/16   Dian Queen, MD Consulting Physician Obstetrics and Gynecology  03/27/16     Subjective:  Anna May is a 45 y.o.  Female  present for CPE. All past medical history, surgical history, allergies, family history, immunizations, medications and social history were updated in the electronic medical record today. All recent labs, ED visits and hospitalizations within the last year were reviewed.  ADHD/Anxiety: Pt states she was tried on stattera last year and lost weight. She tried adderall which caused her to have severe anxiety. She was then removed off that medication and tried on zoloft, which made her gain weight. Oct. 2 she was switched to Wellbutrin, and she feels her anxiety is not controlled. She is biting her nails again and feels anxious, unable to focus. She has results of genomic profile she has completed that tells her what medications she should use with caution. All of the medications above, except the zoloft were listed.   Fhx of melanoma: pt has a fhx of melanoma and personal history of sun damage and atypical/precancerous lesions. Sheis followed routinely with dermatology (Dr. Ubaldo Glassing office).  Health maintenance:  Colonoscopy: No Fhx, screen at 50 Mammogram: completed:02/04/2015--> scheduled for 04/08/16, birads 1. No fhx. Screen yearly. Gynecology orders. Cervical cancer screening: last pap: 03/16/2016, results: normal, completed by: Anna Levin, NP  (physican for women). Immunizations: tdap 05/2014, Influenza 02/2016(encouraged yearly) Infectious disease screening: HIV 2009, completed DEXA: N/A Assistive device: None Oxygen HKV:QQVZ  Patient has a Dental home. Hospitalizations/ED visits: none  Immunization History  Administered Date(s) Administered  . Influenza Split 03/15/2012  . Influenza Whole 04/21/2010, 03/29/2013  . Influenza-Unspecified 04/30/2015, 03/09/2016  . Tdap 05/16/2014    Past Medical History:  Diagnosis Date  . Acne 09/16/2012  . ADHD   . Anxiety   . Decreased libido 09/16/2012  . Insomnia 09/16/2012  . Preventative health care 09/16/2012  . Sun-damaged skin 09/16/2012   Allergies  Allergen Reactions  . Diflunisal Rash    ITCHING   Past Surgical History:  Procedure Laterality Date  . ANTERIOR CRUCIATE LIGAMENT REPAIR  01-2004   right leg - replacement  . SKIN BIOPSY     multiple one with 17 stitches, 1 precancer lesion  . WISDOM TOOTH EXTRACTION  1991   all 4   Family History  Problem Relation Age of Onset  . Arthritis Mother     hands  . Melanoma Maternal Grandmother   . Brain cancer Maternal Grandmother   . Heart attack Maternal Grandfather   . Alcohol abuse Maternal Grandfather   . Heart disease Maternal Grandfather   . Stroke Maternal Grandfather   . Kidney cancer Maternal Grandfather   . Lung cancer Maternal Grandfather   . Melanoma Paternal Grandmother   . Lung cancer Paternal Grandfather    Social History   Social History  . Marital status: Married    Spouse name: Corene Cornea  . Number of children: 2  . Years of education: 75  Occupational History  . Not on file.   Social History Main Topics  . Smoking status: Never Smoker  . Smokeless tobacco: Never Used  . Alcohol use No  . Drug use: No  . Sexual activity: Yes    Partners: Male    Birth control/ protection: Pill     Comment: married   Other Topics Concern  . Not on file   Social History Narrative   Married to Klemme. They  have 2 children Juanda Crumble, [place])   Runner, broadcasting/film/video, BS degree.    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    Takes a daily vitamin. Drinks caffeine, uses herbal remedies.    Exercises routinely.   Smoke detector in the home.    Feels safe in her relationships.      Medication List       Accurate as of 03/27/16  6:12 PM. Always use your most recent med list.          buPROPion 150 MG 12 hr tablet Commonly known as:  WELLBUTRIN SR Take 150 mg by mouth 2 (two) times daily.   norethindrone-ethinyl estradiol 1-20 MG-MCG tablet Commonly known as:  JUNEL FE,GILDESS FE,LOESTRIN FE Take 1 tablet by mouth daily.       No results found for this or any previous visit (from the past 2160 hour(s)).  Dg Chest 2 View  Result Date: 05/28/2015 CLINICAL DATA:  Fever and cough, chest congestion and shortness of breath for 3 days EXAM: CHEST  2 VIEW COMPARISON:  PA and lateral chest x-ray of July 10, 2008 FINDINGS: The lungs are well-expanded. There is no focal infiltrate. There is no pleural effusion. The heart and pulmonary vascularity are normal. The mediastinum is normal in width. The bony thorax exhibits no acute abnormality. IMPRESSION: Mild hyperinflation may be voluntary or could reflect acute bronchitis. There is no evidence of pneumonia nor CHF. Electronically Signed   By: David  Martinique M.D.   On: 05/28/2015 16:57    ROS: 14 pt review of systems performed and negative (unless mentioned in an HPI)  Objective: BP 115/86 (BP Location: Right Arm, Patient Position: Sitting, Cuff Size: Normal)   Pulse 82   Temp 98.3 F (36.8 C)   Resp 20   Ht 5' (1.524 m)   Wt 123 lb 8 oz (56 kg)   LMP 02/28/2016   SpO2 99%   BMI 24.12 kg/m  Gen: Afebrile. No acute distress. Nontoxic in appearance, well-developed, well-nourished,  Pleasant, caucasian female.  HENT: AT. [place]. Bilateral TM visualized and normal in appearance, normal external auditory canal. MMM, no oral lesions, adequate dentition.  Bilateral nares within normal limits. Throat without erythema, ulcerations or exudates. no Cough on exam, no hoarseness on exam. Eyes:Pupils Equal Round Reactive to light, Extraocular movements intact,  Conjunctiva without redness, discharge or icterus. Neck/lymp/endocrine: Supple,no lymphadenopathy, no thyromegaly CV: RRR no murmur, no edema, +2/4 P posterior tibialis pulses. No  carotid bruits Chest: CTAB, no wheeze, rhonchi or crackles. Normal  Respiratory effort. good Air movement. Abd: Soft. Flat . NTND. BS present. no Masses palpated. No hepatosplenomegaly. No rebound tenderness or guarding. Skin: no rashes, purpura or petechiae. Warm and well-perfused. Skin intact. Neuro/Msk:  Normal gait. PERLA. EOMi. Alert. Oriented x3.  Cranial nerves II through XII intact. Muscle strength 5/5 upper/lower extremity. DTRs equal bilaterally. Psych: Anxious. Normal  dress and demeanor. Normal speech. Normal thought content and judgment.  Assessment/plan: Anna May is a 45 y.o. female present for CPE.  Visit for preventive  health examination Patient was encouraged to exercise greater than 150 minutes a week. Patient was encouraged to choose a diet filled with fresh fruits and vegetables, and lean meats. AVS provided to patient today for education/recommendation on gender specific health and safety maintenance. Colonoscopy: No Fhx, screen at 50 Mammogram: completed:02/04/2015--> scheduled for 04/08/16, birads 1. No fhx. Screen yearly. Gynecology orders. Cervical cancer screening: last pap: 03/16/2016, results: normal, completed by: Anna Levin, NP (physican for women). Immunizations: tdap 05/2014, Influenza 02/2016(encouraged yearly) Infectious disease screening: HIV 2009, completed DEXA: N/A Screening cholesterol level - Lipid panel Thyroid disorder screen - TSH - Comp Met (CMET) Screening for deficiency anemia - CBC w/Diff  Attention deficit hyperactivity disorder (ADHD), combined  type/Anxiety state - Wellbutrin continued for now, pt to follow either with gyn or myself for issue, but would not recommend following with both for same issue for sake of continuity.  - encouraged her to stay on medication for at least 6- 8 weeks to see full benefits of medication. Could consider effexor if Wellbutrin not effective or makes anxiety worse.   Return in about 1 year (around 03/27/2017) for CPE.  Electronically signed by: Howard Pouch, DO [place]

## 2016-03-27 NOTE — Patient Instructions (Signed)
Health Maintenance, Female Adopting a healthy lifestyle and getting preventive care can go a long way to promote health and wellness. Talk with your health care provider about what schedule of regular examinations is right for you. This is a good chance for you to check in with your provider about disease prevention and staying healthy. In between checkups, there are plenty of things you can do on your own. Experts have done a lot of research about which lifestyle changes and preventive measures are most likely to keep you healthy. Ask your health care provider for more information. WEIGHT AND DIET  Eat a healthy diet  Be sure to include plenty of vegetables, fruits, low-fat dairy products, and lean protein.  Do not eat a lot of foods high in solid fats, added sugars, or salt.  Get regular exercise. This is one of the most important things you can do for your health.  Most adults should exercise for at least 150 minutes each week. The exercise should increase your heart rate and make you sweat (moderate-intensity exercise).  Most adults should also do strengthening exercises at least twice a week. This is in addition to the moderate-intensity exercise.  Maintain a healthy weight  Body mass index (BMI) is a measurement that can be used to identify possible weight problems. It estimates body fat based on height and weight. Your health care provider can help determine your BMI and help you achieve or maintain a healthy weight.  For females 20 years of age and older:   A BMI below 18.5 is considered underweight.  A BMI of 18.5 to 24.9 is normal.  A BMI of 25 to 29.9 is considered overweight.  A BMI of 30 and above is considered obese.  Watch levels of cholesterol and blood lipids  You should start having your blood tested for lipids and cholesterol at 45 years of age, then have this test every 5 years.  You may need to have your cholesterol levels checked more often if:  Your lipid  or cholesterol levels are high.  You are older than 45 years of age.  You are at high risk for heart disease.  CANCER SCREENING   Lung Cancer  Lung cancer screening is recommended for adults 55-80 years old who are at high risk for lung cancer because of a history of smoking.  A yearly low-dose CT scan of the lungs is recommended for people who:  Currently smoke.  Have quit within the past 15 years.  Have at least a 30-pack-year history of smoking. A pack year is smoking an average of one pack of cigarettes a day for 1 year.  Yearly screening should continue until it has been 15 years since you quit.  Yearly screening should stop if you develop a health problem that would prevent you from having lung cancer treatment.  Breast Cancer  Practice breast self-awareness. This means understanding how your breasts normally appear and feel.  It also means doing regular breast self-exams. Let your health care provider know about any changes, no matter how small.  If you are in your 20s or 30s, you should have a clinical breast exam (CBE) by a health care provider every 1-3 years as part of a regular health exam.  If you are 40 or older, have a CBE every year. Also consider having a breast X-ray (mammogram) every year.  If you have a family history of breast cancer, talk to your health care provider about genetic screening.  If you   are at high risk for breast cancer, talk to your health care provider about having an MRI and a mammogram every year.  Breast cancer gene (BRCA) assessment is recommended for women who have family members with BRCA-related cancers. BRCA-related cancers include:  Breast.  Ovarian.  Tubal.  Peritoneal cancers.  Results of the assessment will determine the need for genetic counseling and BRCA1 and BRCA2 testing. Cervical Cancer Your health care provider may recommend that you be screened regularly for cancer of the pelvic organs (ovaries, uterus, and  vagina). This screening involves a pelvic examination, including checking for microscopic changes to the surface of your cervix (Pap test). You may be encouraged to have this screening done every 3 years, beginning at age 21.  For women ages 30-65, health care providers may recommend pelvic exams and Pap testing every 3 years, or they may recommend the Pap and pelvic exam, combined with testing for human papilloma virus (HPV), every 5 years. Some types of HPV increase your risk of cervical cancer. Testing for HPV may also be done on women of any age with unclear Pap test results.  Other health care providers may not recommend any screening for nonpregnant women who are considered low risk for pelvic cancer and who do not have symptoms. Ask your health care provider if a screening pelvic exam is right for you.  If you have had past treatment for cervical cancer or a condition that could lead to cancer, you need Pap tests and screening for cancer for at least 20 years after your treatment. If Pap tests have been discontinued, your risk factors (such as having a new sexual partner) need to be reassessed to determine if screening should resume. Some women have medical problems that increase the chance of getting cervical cancer. In these cases, your health care provider may recommend more frequent screening and Pap tests. Colorectal Cancer  This type of cancer can be detected and often prevented.  Routine colorectal cancer screening usually begins at 45 years of age and continues through 45 years of age.  Your health care provider may recommend screening at an earlier age if you have risk factors for colon cancer.  Your health care provider may also recommend using home test kits to check for hidden blood in the stool.  A small camera at the end of a tube can be used to examine your colon directly (sigmoidoscopy or colonoscopy). This is done to check for the earliest forms of colorectal  cancer.  Routine screening usually begins at age 50.  Direct examination of the colon should be repeated every 5-10 years through 45 years of age. However, you may need to be screened more often if early forms of precancerous polyps or small growths are found. Skin Cancer  Check your skin from head to toe regularly.  Tell your health care provider about any new moles or changes in moles, especially if there is a change in a mole's shape or color.  Also tell your health care provider if you have a mole that is larger than the size of a pencil eraser.  Always use sunscreen. Apply sunscreen liberally and repeatedly throughout the day.  Protect yourself by wearing long sleeves, pants, a wide-brimmed hat, and sunglasses whenever you are outside. HEART DISEASE, DIABETES, AND HIGH BLOOD PRESSURE   High blood pressure causes heart disease and increases the risk of stroke. High blood pressure is more likely to develop in:  People who have blood pressure in the high end   of the normal range (130-139/85-89 mm Hg).  People who are overweight or obese.  People who are African American.  If you are 38-23 years of age, have your blood pressure checked every 3-5 years. If you are 61 years of age or older, have your blood pressure checked every year. You should have your blood pressure measured twice--once when you are at a hospital or clinic, and once when you are not at a hospital or clinic. Record the average of the two measurements. To check your blood pressure when you are not at a hospital or clinic, you can use:  An automated blood pressure machine at a pharmacy.  A home blood pressure monitor.  If you are between 45 years and 39 years old, ask your health care provider if you should take aspirin to prevent strokes.  Have regular diabetes screenings. This involves taking a blood sample to check your fasting blood sugar level.  If you are at a normal weight and have a low risk for diabetes,  have this test once every three years after 45 years of age.  If you are overweight and have a high risk for diabetes, consider being tested at a younger age or more often. PREVENTING INFECTION  Hepatitis B  If you have a higher risk for hepatitis B, you should be screened for this virus. You are considered at high risk for hepatitis B if:  You were born in a country where hepatitis B is common. Ask your health care provider which countries are considered high risk.  Your parents were born in a high-risk country, and you have not been immunized against hepatitis B (hepatitis B vaccine).  You have HIV or AIDS.  You use needles to inject street drugs.  You live with someone who has hepatitis B.  You have had sex with someone who has hepatitis B.  You get hemodialysis treatment.  You take certain medicines for conditions, including cancer, organ transplantation, and autoimmune conditions. Hepatitis C  Blood testing is recommended for:  Everyone born from [birth year] through 1965.  Anyone with known risk factors for hepatitis C. Sexually transmitted infections (STIs)  You should be screened for sexually transmitted infections (STIs) including gonorrhea and chlamydia if:  You are sexually active and are younger than 45 years of age.  You are older than 45 years of age and your health care provider tells you that you are at risk for this type of infection.  Your sexual activity has changed since you were last screened and you are at an increased risk for chlamydia or gonorrhea. Ask your health care provider if you are at risk.  If you do not have HIV, but are at risk, it may be recommended that you take a prescription medicine daily to prevent HIV infection. This is called pre-exposure prophylaxis (PrEP). You are considered at risk if:  You are sexually active and do not regularly use condoms or know the HIV status of your partner(s).  You take drugs by injection.  You are sexually  active with a partner who has HIV. Talk with your health care provider about whether you are at high risk of being infected with HIV. If you choose to begin PrEP, you should first be tested for HIV. You should then be tested every 3 months for as long as you are taking PrEP.  PREGNANCY   If you are premenopausal and you may become pregnant, ask your health care provider about preconception counseling.  If you may  become pregnant, take 400 to 800 micrograms (mcg) of folic acid every day.  If you want to prevent pregnancy, talk to your health care provider about birth control (contraception). OSTEOPOROSIS AND MENOPAUSE   Osteoporosis is a disease in which the bones lose minerals and strength with aging. This can result in serious bone fractures. Your risk for osteoporosis can be identified using a bone density scan.  If you are 61 years of age or older, or if you are at risk for osteoporosis and fractures, ask your health care provider if you should be screened.  Ask your health care provider whether you should take a calcium or vitamin D supplement to lower your risk for osteoporosis.  Menopause may have certain physical symptoms and risks.  Hormone replacement therapy may reduce some of these symptoms and risks. Talk to your health care provider about whether hormone replacement therapy is right for you.  HOME CARE INSTRUCTIONS   Schedule regular health, dental, and eye exams.  Stay current with your immunizations.   Do not use any tobacco products including cigarettes, chewing tobacco, or electronic cigarettes.  If you are pregnant, do not drink alcohol.  If you are breastfeeding, limit how much and how often you drink alcohol.  Limit alcohol intake to no more than 1 drink per day for nonpregnant women. One drink equals 12 ounces of beer, 5 ounces of wine, or 1 ounces of hard liquor.  Do not use street drugs.  Do not share needles.  Ask your health care provider for help if  you need support or information about quitting drugs.  Tell your health care provider if you often feel depressed.  Tell your health care provider if you have ever been abused or do not feel safe at home.   This information is not intended to replace advice given to you by your health care provider. Make sure you discuss any questions you have with your health care provider.   Document Released: 12/15/2010 Document Revised: 06/22/2014 Document Reviewed: 05/03/2013 Elsevier Interactive Patient Education [employer].

## 2016-04-01 ENCOUNTER — Telehealth: Payer: Self-pay | Admitting: *Deleted

## 2016-04-01 NOTE — Telephone Encounter (Signed)
Spoke with patient reviewed the information. Patient verbalized understanding.

## 2016-04-01 NOTE — Telephone Encounter (Signed)
It is difficult to advise her on this since she is being managed by Her GYN and I am not certain what she has questions regarding. I can tell her some people are on these meds together and that is ok to do.

## 2016-04-01 NOTE — Telephone Encounter (Signed)
Patient called and left message stating she had seen her OB/GYN and had discussed her recent symptoms and her OB suggested she lower bupropron dose and start venlafaxine with it . She wants to know what your recommendation is. Please advise.

## 2016-04-21 IMAGING — DX DG CHEST 2V
2 series · 2 of 2 positions shown · non-contrast
Comparison: PA and lateral chest x-ray July 10, 2008

CLINICAL DATA: Fever and cough, chest congestion and shortness of
breath for 3 days

EXAM:
CHEST  2 VIEW

[chest pa]
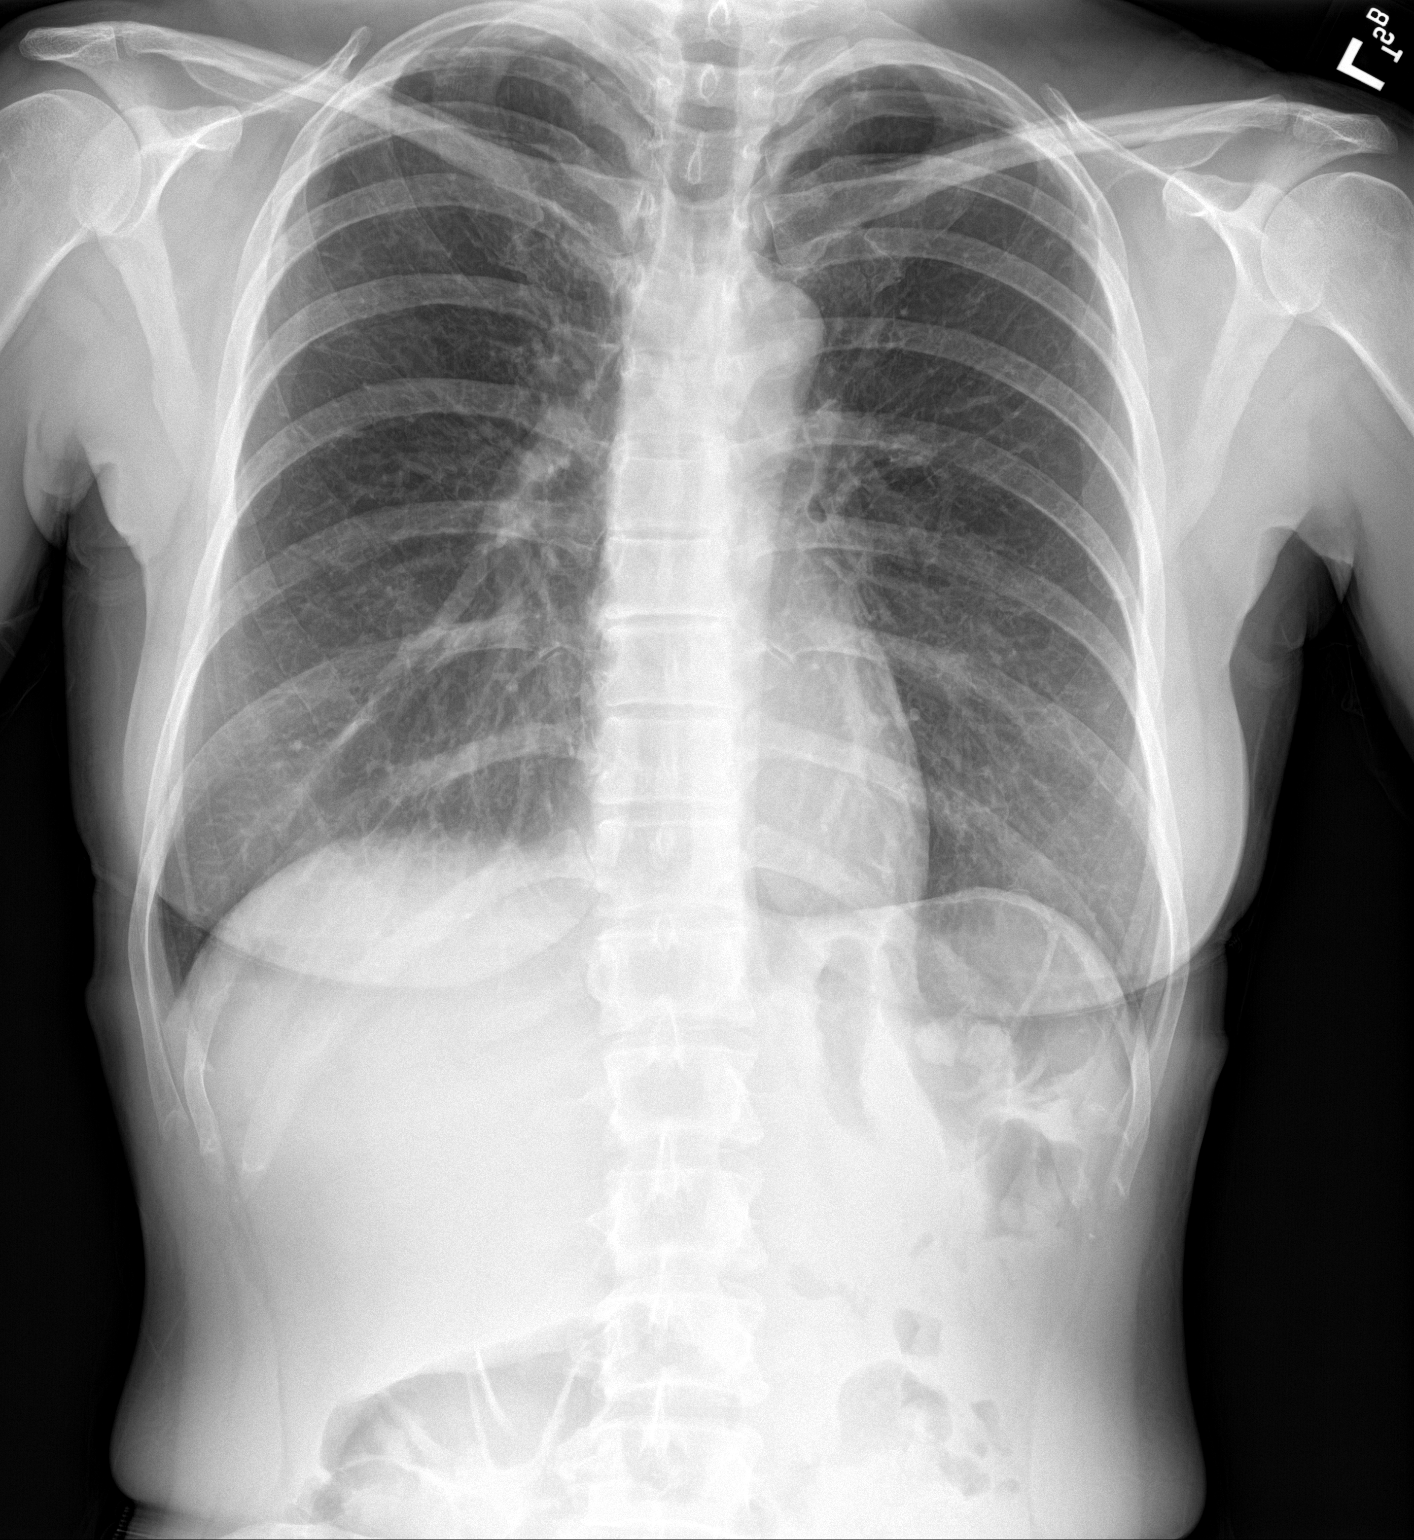

[chest lat]
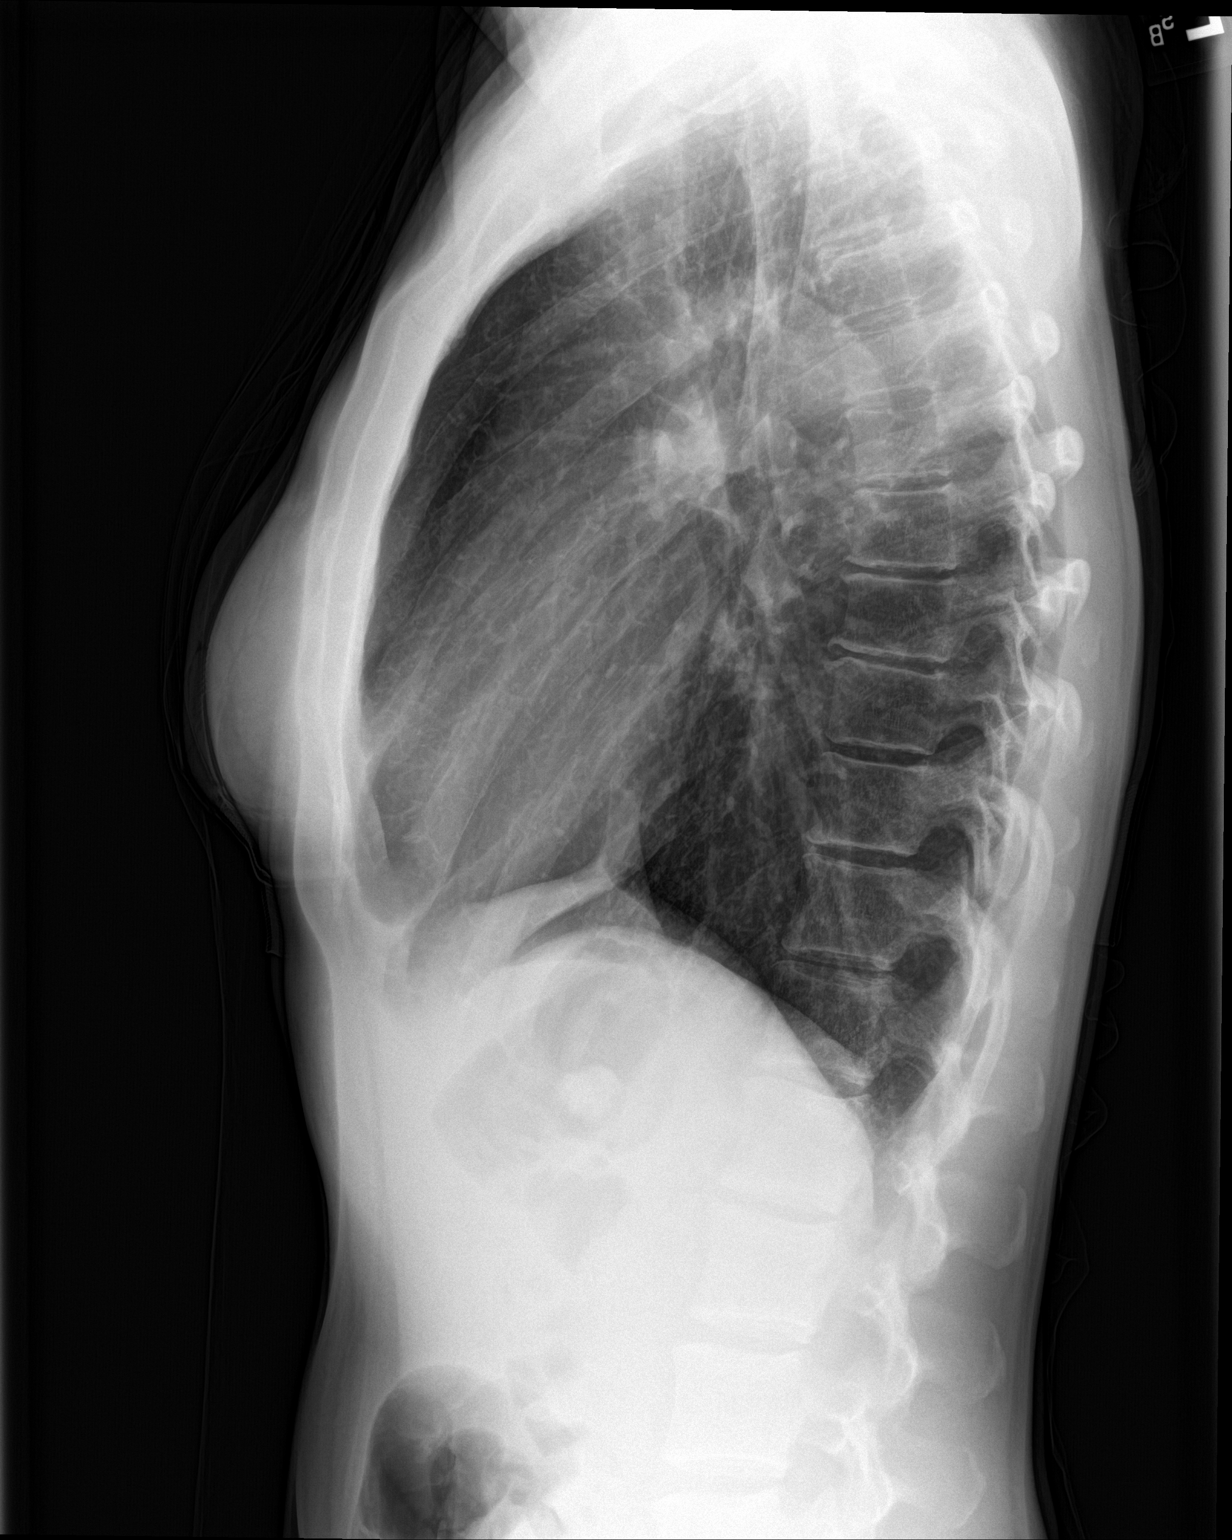

[2 of 2 positions shown; findings below may reference images not displayed]

FINDINGS: The lungs are well-expanded. There is no focal infiltrate. There is
no pleural effusion. The heart and pulmonary vascularity are normal.
The mediastinum is normal in width. The bony thorax exhibits no
acute abnormality.
IMPRESSION: Mild hyperinflation may be voluntary or could reflect acute
bronchitis. There is no evidence of pneumonia nor CHF.

## 2016-04-22 ENCOUNTER — Telehealth: Payer: Self-pay | Admitting: *Deleted

## 2016-04-22 NOTE — Telephone Encounter (Signed)
Left message for patient we have not received her lab work results requested call back from patient to verify if labs were collected .she was having them drawn at [facility] per her employer. Advised patient in message we need copy of results to complete her health assessment form.

## 2016-04-28 ENCOUNTER — Encounter: Payer: Self-pay | Admitting: *Deleted

## 2016-06-30 ENCOUNTER — Ambulatory Visit (INDEPENDENT_AMBULATORY_CARE_PROVIDER_SITE_OTHER): Payer: Managed Care, Other (non HMO) | Admitting: Family Medicine

## 2016-06-30 ENCOUNTER — Encounter: Payer: Self-pay | Admitting: Family Medicine

## 2016-06-30 VITALS — BP 108/72 | HR 68 | Temp 98.9°F | Resp 20 | Wt 128.0 lb

## 2016-06-30 DIAGNOSIS — J0100 Acute maxillary sinusitis, unspecified: Secondary | ICD-10-CM

## 2016-06-30 MED ORDER — AMOXICILLIN-POT CLAVULANATE 875-125 MG PO TABS: 1.0000 | ORAL_TABLET | Freq: Two times a day (BID) | ORAL | 0 refills | Status: DC

## 2016-06-30 MED ORDER — PREDNISONE 50 MG PO TABS: 50.0000 mg | ORAL_TABLET | Freq: Every day | ORAL | 0 refills | Status: DC

## 2016-06-30 NOTE — Progress Notes (Signed)
[name] , 1970-10-11, 46 y.o., female MRN: [id number] Patient May Team    Relationship Specialty Notifications Start End  Natalia Leatherwood, DO PCP - General Family Medicine  03/27/16   Laverta Baltimore, NP Nurse Practitioner Nurse Practitioner  03/27/16    Comment: GYN NP for Anna Shuck, MD Consulting Physician Orthopedic Surgery  03/27/16   Oretha Milch, MD Consulting Physician Pulmonary Disease  03/27/16   Venancio Poisson, MD Consulting Physician Dermatology  03/27/16   Marcelle Overlie, MD Consulting Physician Obstetrics and Gynecology  03/27/16     CC: cough  Subjective: Pt presents for an acute OV with complaints of cough of 3 days duration.  Associated symptoms include sore throat, drainage, fatigue.  Pt has tried mucinex DM to ease their symptoms. She reports the sore throat over night is horrible with coughing runny nose, and drainage. She denies fever, chills, nausea or vomit. No sick contacts.   Allergies  Allergen Reactions  . Diflunisal Rash    ITCHING   Social History  Substance Use Topics  . Smoking status: Never Smoker  . Smokeless tobacco: Never Used  . Alcohol use No   Past Medical History:  Diagnosis Date  . Acne 09/16/2012  . ADHD   . Anxiety   . Decreased libido 09/16/2012  . Insomnia 09/16/2012  . Preventative health May 09/16/2012  . Sun-damaged skin 09/16/2012   Past Surgical History:  Procedure Laterality Date  . ANTERIOR CRUCIATE LIGAMENT REPAIR  01-2004   right leg - replacement  . SKIN BIOPSY     multiple one with 17 stitches, 1 precancer lesion  . WISDOM TOOTH EXTRACTION  1991   all 4   Family History  Problem Relation Age of Onset  . Arthritis Mother     hands  . Melanoma Maternal Grandmother   . Brain cancer Maternal Grandmother   . Heart attack Maternal Grandfather   . Alcohol abuse Maternal Grandfather   . Heart disease Maternal Grandfather   . Stroke Maternal Grandfather   . Kidney cancer Maternal Grandfather   .  Lung cancer Maternal Grandfather   . Melanoma Paternal Grandmother   . Lung cancer Paternal Grandfather    Allergies as of 06/30/2016      Reactions   Diflunisal Rash   ITCHING      Medication List       Accurate as of 06/30/16  2:03 PM. Always use your most recent med list.          atomoxetine 40 MG capsule Commonly known as:  STRATTERA   norethindrone-ethinyl estradiol 1-20 MG-MCG tablet Commonly known as:  JUNEL FE,GILDESS FE,LOESTRIN FE Take 1 tablet by mouth daily.   sertraline 50 MG tablet Commonly known as:  ZOLOFT Take 1 tablet by mouth daily.       No results found for this or any previous visit (from the past 24 hour(s)). No results found.   ROS: Negative, with the exception of above mentioned in HPI  Objective:  BP 108/72 (BP Location: Right Arm, Patient Position: Sitting, Cuff Size: Normal)   Pulse 68   Temp 98.9 F (37.2 C)   Resp 20   Wt 128 lb (58.1 kg)   SpO2 99%   BMI 25.00 kg/m  Body mass index is 25 kg/m. Gen: Afebrile. No acute distress. Nontoxic in appearance, well developed, well nourished.  HENT: AT. Anna May. Bilateral TM visualized right erythema. MMM, no oral lesions. Bilateral nares with erythema, drainage.  Throat without erythema or exudates. PND presnet. Cough present. TTP max sinus.  Eyes:Pupils Equal Round Reactive to light, Extraocular movements intact,  Conjunctiva without redness, discharge or icterus. Neck/lymp/endocrine: Supple,bilateral ant cervical  lymphadenopathy CV: RRR Chest: CTAB, no wheeze or crackles.   Abd: Soft.NTND. BS present.  Assessment/Plan: Anna May is a 46 y.o. female present for acute OV for  Acute maxillary sinusitis, recurrence not specified Augmentin BID x 10 days. Prednisone taper Rest, hydrate, flonase and mucinex.  F/U PRN  electronically signed by:  Felix Pacinienee Kuneff, DO  [facility] - [place]

## 2016-06-30 NOTE — Patient Instructions (Signed)
Sinusitis, Adult Sinusitis is soreness and inflammation of your sinuses. Sinuses are hollow spaces in the bones around your face. They are located:  Around your eyes.  In the middle of your forehead.  Behind your nose.  In your cheekbones. Your sinuses and nasal passages are lined with a stringy fluid (mucus). Mucus normally drains out of your sinuses. When your nasal tissues get inflamed or swollen, the mucus can get trapped or blocked so air cannot flow through your sinuses. This lets bacteria, viruses, and funguses grow, and that leads to infection. Follow these instructions at home: Medicines  Take, use, or apply over-the-counter and prescription medicines only as told by your doctor. These may include nasal sprays.  If you were prescribed an antibiotic medicine, take it as told by your doctor. Do not stop taking the antibiotic even if you start to feel better. Hydrate and Humidify  Drink enough water to keep your pee (urine) clear or pale yellow.  Use a cool mist humidifier to keep the humidity level in your home above 50%.  Breathe in steam for 10-15 minutes, 3-4 times a day or as told by your doctor. You can do this in the bathroom while a hot shower is running.  Try not to spend time in cool or dry air. Rest  Rest as much as possible.  Sleep with your head raised (elevated).  Make sure to get enough sleep each night. General instructions  Put a warm, moist washcloth on your face 3-4 times a day or as told by your doctor. This will help with discomfort.  Wash your hands often with soap and water. If there is no soap and water, use hand sanitizer.  Do not smoke. Avoid being around people who are smoking (secondhand smoke).  Keep all follow-up visits as told by your doctor. This is important. Contact a doctor if:  You have a fever.  Your symptoms get worse.  Your symptoms do not get better within 10 days. Get help right away if:  You have a very bad  headache.  You cannot stop throwing up (vomiting).  You have pain or swelling around your face or eyes.  You have trouble seeing.  You feel confused.  Your neck is stiff.  You have trouble breathing. This information is not intended to replace advice given to you by your health care provider. Make sure you discuss any questions you have with your health care provider. Document Released: 11/18/2007 Document Revised: 01/26/2016 Document Reviewed: 03/27/2015 Elsevier Interactive Patient Education  2017 Elsevier Inc.  Start Augmentin and prednisone. Rest, hydrate, flonase and mucinex DM

## 2016-07-27 ENCOUNTER — Ambulatory Visit (INDEPENDENT_AMBULATORY_CARE_PROVIDER_SITE_OTHER): Payer: Managed Care, Other (non HMO) | Admitting: Family Medicine

## 2016-07-27 ENCOUNTER — Encounter: Payer: Self-pay | Admitting: Family Medicine

## 2016-07-27 VITALS — BP 123/89 | HR 119 | Temp 100.9°F | Resp 20 | Wt 125.8 lb

## 2016-07-27 DIAGNOSIS — R059 Cough, unspecified: Secondary | ICD-10-CM

## 2016-07-27 DIAGNOSIS — R05 Cough: Secondary | ICD-10-CM | POA: Diagnosis not present

## 2016-07-27 DIAGNOSIS — B349 Viral infection, unspecified: Secondary | ICD-10-CM | POA: Diagnosis not present

## 2016-07-27 DIAGNOSIS — R6889 Other general symptoms and signs: Secondary | ICD-10-CM | POA: Diagnosis not present

## 2016-07-27 LAB — POC INFLUENZA A&B (BINAX/QUICKVUE)
INFLUENZA A, POC: NEGATIVE
Influenza B, POC: NEGATIVE

## 2016-07-27 NOTE — Progress Notes (Signed)
KELLYANNE ELLWANGER , 1971-04-02, 46 y.o., female MRN: [id number] Patient Care Team    Relationship Specialty Notifications Start End  Natalia Leatherwood, DO PCP - General Family Medicine  03/27/16   Laverta Baltimore, NP Nurse Practitioner Nurse Practitioner  03/27/16    Comment: GYN NP for Lovenia Shuck, MD Consulting Physician Orthopedic Surgery  03/27/16   Oretha Milch, MD Consulting Physician Pulmonary Disease  03/27/16   Venancio Poisson, MD Consulting Physician Dermatology  03/27/16   Marcelle Overlie, MD Consulting Physician Obstetrics and Gynecology  03/27/16     CC: Cough Subjective: Pt presents for an acute OV with complaints of cough of 2 days duration.  Associated symptoms include a lot of drainage, nausea, fever(101F) and myalgia. Pt has had her flu shot this year. Started the stomach bug like symptoms, nausea and decreased appetite. Pt has not tried anything OTC.    Allergies  Allergen Reactions  . Diflunisal Rash    ITCHING   Social History  Substance Use Topics  . Smoking status: Never Smoker  . Smokeless tobacco: Never Used  . Alcohol use No   Past Medical History:  Diagnosis Date  . Acne 09/16/2012  . ADHD   . Anxiety   . Decreased libido 09/16/2012  . Insomnia 09/16/2012  . Preventative health care 09/16/2012  . Sun-damaged skin 09/16/2012   Past Surgical History:  Procedure Laterality Date  . ANTERIOR CRUCIATE LIGAMENT REPAIR  01-2004   right leg - replacement  . SKIN BIOPSY     multiple one with 17 stitches, 1 precancer lesion  . WISDOM TOOTH EXTRACTION  1991   all 4   Family History  Problem Relation Age of Onset  . Arthritis Mother     hands  . Melanoma Maternal Grandmother   . Brain cancer Maternal Grandmother   . Heart attack Maternal Grandfather   . Alcohol abuse Maternal Grandfather   . Heart disease Maternal Grandfather   . Stroke Maternal Grandfather   . Kidney cancer Maternal Grandfather   . Lung cancer Maternal Grandfather   . Melanoma  Paternal Grandmother   . Lung cancer Paternal Grandfather    Allergies as of 07/27/2016      Reactions   Diflunisal Rash   ITCHING      Medication List       Accurate as of 07/27/16  4:31 PM. Always use your most recent med list.          atomoxetine 40 MG capsule Commonly known as:  STRATTERA   norethindrone-ethinyl estradiol 1-20 MG-MCG tablet Commonly known as:  JUNEL FE,GILDESS FE,LOESTRIN FE Take 1 tablet by mouth daily.   sertraline 50 MG tablet Commonly known as:  ZOLOFT Take 1 tablet by mouth daily.       Results for orders placed or performed in visit on 07/27/16 (from the past 24 hour(s))  POC Influenza A&B (Binax test)     Status: Normal   Collection Time: 07/27/16  4:30 PM  Result Value Ref Range   Influenza A, POC Negative Negative   Influenza B, POC Negative Negative   No results found.   ROS: Negative, with the exception of above mentioned in HPI   Objective:  BP 123/89 (BP Location: Right Arm, Patient Position: Sitting, Cuff Size: Normal)   Pulse (!) 119   Temp (!) 100.9 F (38.3 C)   Resp 20   Wt 125 lb 12 oz (57 kg)   SpO2 98%  BMI 24.56 kg/m  Body mass index is 24.56 kg/m. Gen: febrile. No acute distress. Nontoxic in appearance, well developed, well nourished.  HENT: AT. [place]. Bilateral TM visualized bilateral fullness. MMM, no oral lesions. Bilateral nares with erythema and drianage. Throat without erythema or exudates. Cough present.  Eyes:Pupils Equal Round Reactive to light, Extraocular movements intact,  Conjunctiva without redness, discharge or icterus. Neck/lymp/endocrine: Supple,no lymphadenopathy CV: tachycardic Chest: CTAB, no wheeze or crackles. Good air movement, normal resp effort. Cough present Abd: Soft. NTND. BS present. no Masses palpated.  Skin: no rashes, purpura or petechiae.  Neuro: Normal gait. PERLA. EOMi. Alert. Oriented x3   Results for orders placed or performed in visit on 07/27/16 (from the past 24 hour(s))    POC Influenza A&B (Binax test)     Status: Normal   Collection Time: 07/27/16  4:30 PM  Result Value Ref Range   Influenza A, POC Negative Negative   Influenza B, POC Negative Negative     Assessment/Plan: Dalina T Arvilla MarketMills is a 46 y.o. female present for acute OV for  Flu-like symptoms - POC Influenza A&B (Binax test) Acute viral syndrome/Cough Rest, hydrate, OTC tx, mucinex DM If not improved by Thursday would call in a z-pack for her.  F/u PRN   electronically signed by:  Felix Pacinienee Leahann Lempke, DO  [facility] - [place]

## 2016-07-27 NOTE — Patient Instructions (Signed)
Influenza is negative today.  This is likely viral since only 2 days into symptoms.   Treat with rest, hydration, Mucinex DM, advil/tylenol.    Cough, Adult Introduction A cough helps to clear your throat and lungs. A cough may last only 2-3 weeks (acute), or it may last longer than 8 weeks (chronic). Many different things can cause a cough. A cough may be a sign of an illness or another medical condition. Follow these instructions at home:  Pay attention to any changes in your cough.  Take medicines only as told by your doctor.  If you were prescribed an antibiotic medicine, take it as told by your doctor. Do not stop taking it even if you start to feel better.  Talk with your doctor before you try using a cough medicine.  Drink enough fluid to keep your pee (urine) clear or pale yellow.  If the air is dry, use a cold steam vaporizer or humidifier in your home.  Stay away from things that make you cough at work or at home.  If your cough is worse at night, try using extra pillows to raise your head up higher while you sleep.  Do not smoke, and try not to be around smoke. If you need help quitting, ask your doctor.  Do not have caffeine.  Do not drink alcohol.  Rest as needed. Contact a doctor if:  You have new problems (symptoms).  You cough up yellow fluid (pus).  Your cough does not get better after 2-3 weeks, or your cough gets worse.  Medicine does not help your cough and you are not sleeping well.  You have pain that gets worse or pain that is not helped with medicine.  You have a fever.  You are losing weight and you do not know why.  You have night sweats. Get help right away if:  You cough up blood.  You have trouble breathing.  Your heartbeat is very fast. This information is not intended to replace advice given to you by your health care provider. Make sure you discuss any questions you have with your health care provider. Document Released:  02/12/2011 Document Revised: 11/07/2015 Document Reviewed: 08/08/2014  2017 Elsevier

## 2016-07-29 ENCOUNTER — Telehealth: Payer: Self-pay | Admitting: Family Medicine

## 2016-07-29 MED ORDER — AZITHROMYCIN 250 MG PO TABS: ORAL_TABLET | ORAL | 0 refills | Status: DC

## 2016-07-29 MED ORDER — BENZONATATE 100 MG PO CAPS: 200.0000 mg | ORAL_CAPSULE | Freq: Two times a day (BID) | ORAL | 0 refills | Status: DC | PRN

## 2016-07-29 NOTE — Telephone Encounter (Signed)
I had encouraged her to call in and told her I would call in zpack for her if not improved.  zpack and tessalon perles for cough called in. - continue mucinex DM as well/

## 2016-07-29 NOTE — Telephone Encounter (Signed)
Patient calling to report she was seen earlier this week and was told to call back if not feeling better.  She reports she has a fever and severe nonproductive cough that is keeping her up at night.  The coughing spells makes her feel as if she is going to vomit.  She is requesting a zpack and cough medicine.  [facility] [address] [place] [facility] [place] - [id number] [phone] (Phone) [phone] (Fax)

## 2016-07-29 NOTE — Telephone Encounter (Signed)
Spoke with patient reviewed information. 

## 2016-07-31 ENCOUNTER — Telehealth: Payer: Self-pay | Admitting: Family Medicine

## 2016-07-31 MED ORDER — DOXYCYCLINE HYCLATE 100 MG PO TABS: 100.0000 mg | ORAL_TABLET | Freq: Two times a day (BID) | ORAL | 0 refills | Status: DC

## 2016-07-31 NOTE — Telephone Encounter (Signed)
Doxycyline prescribed, if not improved she needs an appointment.

## 2016-07-31 NOTE — Telephone Encounter (Signed)
Pt called stating she is on 3rd day of ZPak and she still is having 100 fever and continues to cough but nothing is coming up, she feels like is at a plateau, has gotten a little better but still feels bad. Please call her when possible.

## 2016-07-31 NOTE — Telephone Encounter (Signed)
Detailed message left on patients voice mail. Okay per DPR. 

## 2016-08-03 ENCOUNTER — Telehealth: Payer: Self-pay | Admitting: Family Medicine

## 2016-08-03 NOTE — Telephone Encounter (Signed)
Patient wants to know if her medication (doxycycline) should be adjusted because she is still coughing badly. She does not want to sched an acute visit if it can be avoided.  She just saw Dr. Claiborne BillingsKuneff on Monday the 12th of February.  Thank you,  -LL

## 2016-08-03 NOTE — Telephone Encounter (Signed)
Spoke with patient per Dr Claiborne BillingsKuneff patient is to complete current Rx of Doxycycline she can use Mucinex DM as needed and if no improvement after completion of antibiotic patient will need to be seen. Patient verbalized understanding.

## 2017-04-16 NOTE — Progress Notes (Signed)
Ms. Anna May received her flu shot at [facility] Lot # [id number] NDC: [id number] Mfg: [employer] Expires:12/12/17 To Left Deltoid

## 2017-09-14 ENCOUNTER — Encounter: Payer: Self-pay | Admitting: Family Medicine

## 2017-09-14 ENCOUNTER — Ambulatory Visit: Payer: Managed Care, Other (non HMO) | Admitting: Family Medicine

## 2017-09-14 VITALS — BP 106/79 | HR 94 | Temp 98.7°F | Resp 18 | Ht 60.0 in | Wt 124.0 lb

## 2017-09-14 DIAGNOSIS — J029 Acute pharyngitis, unspecified: Secondary | ICD-10-CM

## 2017-09-14 DIAGNOSIS — J0100 Acute maxillary sinusitis, unspecified: Secondary | ICD-10-CM | POA: Diagnosis not present

## 2017-09-14 LAB — POCT RAPID STREP A (OFFICE): Rapid Strep A Screen: NEGATIVE

## 2017-09-14 MED ORDER — AMOXICILLIN-POT CLAVULANATE 875-125 MG PO TABS: 1.0000 | ORAL_TABLET | Freq: Two times a day (BID) | ORAL | 0 refills | Status: DC

## 2017-09-14 NOTE — Progress Notes (Signed)
Anna May , [birth year], 47 y.o., female MRN: [id number] Patient Care Team    Relationship Specialty Notifications Start End  Natalia LeatherwoodKuneff, Volanda Mangine A, DO PCP - General Family Medicine  03/27/16   Laverta BaltimoreNewsome, Elizabeth C, NP Nurse Practitioner Nurse Practitioner  03/27/16    Comment: GYN NP for Lovenia ShuckGrewal  Lucey, Steve, MD Consulting Physician Orthopedic Surgery  03/27/16   Oretha MilchAlva, Rakesh V, MD Consulting Physician Pulmonary Disease  03/27/16   Venancio PoissonLomax, Laura, MD Consulting Physician Dermatology  03/27/16   Marcelle OverlieGrewal, Michelle, MD Consulting Physician Obstetrics and Gynecology  03/27/16     Chief Complaint  Patient presents with  . Sore Throat    ear pain,fever,facial pain, yellow drainage     Subjective: Pt presents for an OV with complaints of ear pain, fever, facial pain chills, achiness and yellow drainage  of 3 days duration.  She felt a fever come on last night, sx started a few days prior.  Her husband and her child has been ill.  Pt has tried nyquil to ease their symptoms.   Depression screen [facility] 2/9 09/14/2017 03/27/2016  Decreased Interest 0 0  Down, Depressed, Hopeless 0 0  PHQ - 2 Score 0 0    Allergies  Allergen Reactions  . Diflunisal Rash    ITCHING   Social History   Tobacco Use  . Smoking status: Never Smoker  . Smokeless tobacco: Never Used  Substance Use Topics  . Alcohol use: No   Past Medical History:  Diagnosis Date  . Acne 09/16/2012  . ADHD   . Anxiety   . Decreased libido 09/16/2012  . Insomnia 09/16/2012  . Preventative health care 09/16/2012  . Sun-damaged skin 09/16/2012   Past Surgical History:  Procedure Laterality Date  . ANTERIOR CRUCIATE LIGAMENT REPAIR  01-2004   right leg - replacement  . SKIN BIOPSY     multiple one with 17 stitches, 1 precancer lesion  . WISDOM TOOTH EXTRACTION  1991   all 4   Family History  Problem Relation Age of Onset  . Arthritis Mother        hands  . Melanoma Maternal Grandmother   . Brain cancer Maternal  Grandmother   . Heart attack Maternal Grandfather   . Alcohol abuse Maternal Grandfather   . Heart disease Maternal Grandfather   . Stroke Maternal Grandfather   . Kidney cancer Maternal Grandfather   . Lung cancer Maternal Grandfather   . Melanoma Paternal Grandmother   . Lung cancer Paternal Grandfather    Allergies as of 09/14/2017      Reactions   Diflunisal Rash   ITCHING      Medication List        Accurate as of 09/14/17  9:53 AM. Always use your most recent med list.          amoxicillin-clavulanate 875-125 MG tablet Commonly known as:  AUGMENTIN Take 1 tablet by mouth 2 (two) times daily.   atomoxetine 40 MG capsule Commonly known as:  STRATTERA   desvenlafaxine 50 MG 24 hr tablet Commonly known as:  PRISTIQ Take 50 mg by mouth daily.   norethindrone-ethinyl estradiol 1-20 MG-MCG tablet Commonly known as:  JUNEL FE,GILDESS FE,LOESTRIN FE Take 1 tablet by mouth daily.       All past medical history, surgical history, allergies, family history, immunizations andmedications were updated in the EMR today and reviewed under the history and medication portions of their EMR.     ROS: Negative, with the exception  of above mentioned in HPI   Objective:  BP 106/79 (BP Location: Right Arm, Patient Position: Sitting, Cuff Size: Normal)   Pulse 94   Temp 98.7 F (37.1 C)   Resp 18   Ht 5' (1.524 m)   Wt 124 lb (56.2 kg)   LMP 09/12/2017   SpO2 99%   BMI 24.22 kg/m  Body mass index is 24.22 kg/m. Gen: Afebrile. No acute distress. Nontoxic in appearance, well developed, well nourished. Pleasant caucasian female.  HENT: AT. [place]. Bilateral TM visualized with fullness sbilaterally. MMM, no oral lesions. Bilateral nares with erythema, swelling and drainage. Throat without erythema or exudates. Mild cough, Hoarseness and TTP max sinus present.  Eyes:Pupils Equal Round Reactive to light, Extraocular movements intact,  Conjunctiva without redness, discharge or  icterus. Neck/lymp/endocrine: Supple,no lymphadenopathy CV: RRR  Chest: CTAB, no wheeze or crackles.  Abd: Soft.  NTND. BS present. Skin: no rashes, purpura or petechiae.  Neuro:  Normal gait. PERLA. EOMi. Alert. Oriented x3  Psych: Normal affect, dress and demeanor. Normal speech. Normal thought content and judgment.  No exam data present No results found. Results for orders placed or performed in visit on 09/14/17 (from the past 24 hour(s))  POCT rapid strep A     Status: Normal   Collection Time: 09/14/17  9:44 AM  Result Value Ref Range   Rapid Strep A Screen Negative Negative    Assessment/Plan: Anna May is a 47 y.o. female present for OV for  Sore throat - POCT rapid strep A--> Negative Acute non-recurrent maxillary sinusitis Rest, hydrate.  + flonase, mucinex (DM if cough), nettie pot or nasal saline.  augmentin prescribed, take until completed.  If cough present it can last up to 6-8 weeks.  F/U 2 weeks of not improved.    Reviewed expectations re: course of current medical issues.  Discussed self-management of symptoms.  Outlined signs and symptoms indicating need for more acute intervention.  Patient verbalized understanding and all questions were answered.  Patient received an After-Visit Summary.    Orders Placed This Encounter  Procedures  . POCT rapid strep A     Note is dictated utilizing voice recognition software. Although note has been proof read prior to signing, occasional typographical errors still can be missed. If any questions arise, please do not hesitate to call for verification.   electronically signed by:  Anna Pacini, DO  [facility] - [place]

## 2017-09-14 NOTE — Patient Instructions (Signed)
   Rest, hydrate.  + flonase, mucinex (DM if cough), nettie pot or nasal saline.  augmentin prescribed, take until completed.  If cough present it can last up to 6-8 weeks.  F/U 2 weeks of not improved.     Sinusitis, Adult Sinusitis is soreness and inflammation of your sinuses. Sinuses are hollow spaces in the bones around your face. They are located:  Around your eyes.  In the middle of your forehead.  Behind your nose.  In your cheekbones.  Your sinuses and nasal passages are lined with a stringy fluid (mucus). Mucus normally drains out of your sinuses. When your nasal tissues get inflamed or swollen, the mucus can get trapped or blocked so air cannot flow through your sinuses. This lets bacteria, viruses, and funguses grow, and that leads to infection. Follow these instructions at home: Medicines  Take, use, or apply over-the-counter and prescription medicines only as told by your doctor. These may include nasal sprays.  If you were prescribed an antibiotic medicine, take it as told by your doctor. Do not stop taking the antibiotic even if you start to feel better. Hydrate and Humidify  Drink enough water to keep your pee (urine) clear or pale yellow.  Use a cool mist humidifier to keep the humidity level in your home above 50%.  Breathe in steam for 10-15 minutes, 3-4 times a day or as told by your doctor. You can do this in the bathroom while a hot shower is running.  Try not to spend time in cool or dry air. Rest  Rest as much as possible.  Sleep with your head raised (elevated).  Make sure to get enough sleep each night. General instructions  Put a warm, moist washcloth on your face 3-4 times a day or as told by your doctor. This will help with discomfort.  Wash your hands often with soap and water. If there is no soap and water, use hand sanitizer.  Do not smoke. Avoid being around people who are smoking (secondhand smoke).  Keep all follow-up visits as  told by your doctor. This is important. Contact a doctor if:  You have a fever.  Your symptoms get worse.  Your symptoms do not get better within 10 days. Get help right away if:  You have a very bad headache.  You cannot stop throwing up (vomiting).  You have pain or swelling around your face or eyes.  You have trouble seeing.  You feel confused.  Your neck is stiff.  You have trouble breathing. This information is not intended to replace advice given to you by your health care provider. Make sure you discuss any questions you have with your health care provider. Document Released: 11/18/2007 Document Revised: 01/26/2016 Document Reviewed: 03/27/2015 Elsevier Interactive Patient Education  [employer]2018 Elsevier Inc.

## 2017-10-25 ENCOUNTER — Telehealth: Payer: Self-pay | Admitting: Family Medicine

## 2017-10-25 NOTE — Telephone Encounter (Signed)
Copied from CRM [id number]. Topic: Inquiry >> Oct 25, 2017  4:27 PM Alexander Bergeron B wrote: Reason for CRM: pt called about a nagging cough that she has and all the OTC meds are not working, pt would like to be contacted if needed but would like a medication turned in, call pt to advise

## 2017-10-26 ENCOUNTER — Ambulatory Visit: Payer: Managed Care, Other (non HMO) | Admitting: Family Medicine

## 2017-10-26 ENCOUNTER — Encounter: Payer: Self-pay | Admitting: Family Medicine

## 2017-10-26 VITALS — BP 120/85 | HR 77 | Temp 99.0°F | Resp 20 | Ht 60.0 in | Wt 124.0 lb

## 2017-10-26 DIAGNOSIS — R059 Cough, unspecified: Secondary | ICD-10-CM

## 2017-10-26 DIAGNOSIS — R05 Cough: Secondary | ICD-10-CM

## 2017-10-26 DIAGNOSIS — J0100 Acute maxillary sinusitis, unspecified: Secondary | ICD-10-CM | POA: Diagnosis not present

## 2017-10-26 MED ORDER — IPRATROPIUM BROMIDE 0.06 % NA SOLN: 2.0000 | Freq: Four times a day (QID) | NASAL | 12 refills | Status: DC

## 2017-10-26 MED ORDER — METHYLPREDNISOLONE ACETATE 80 MG/ML IJ SUSP
80.0000 mg | Freq: Once | INTRAMUSCULAR | Status: AC
  Administered 2017-10-26: 80 mg via INTRAMUSCULAR

## 2017-10-26 MED ORDER — LEVOFLOXACIN 500 MG PO TABS: 500.0000 mg | ORAL_TABLET | Freq: Every day | ORAL | 0 refills | Status: DC

## 2017-10-26 MED ORDER — HYDROCODONE-HOMATROPINE 5-1.5 MG/5ML PO SYRP: 5.0000 mL | ORAL_SOLUTION | Freq: Every day | ORAL | 0 refills | Status: DC

## 2017-10-26 NOTE — Telephone Encounter (Signed)
Pt called back and scheduled an appt for this am. Nothing further needed.

## 2017-10-26 NOTE — Progress Notes (Signed)
Anna May , 09-Jan-1971, 47 y.o., female MRN: [id number] Patient Care Team    Relationship Specialty Notifications Start End  Natalia Leatherwood, DO PCP - General Family Medicine  03/27/16   Laverta Baltimore, NP Nurse Practitioner Nurse Practitioner  03/27/16    Comment: GYN NP for Lovenia Shuck, MD Consulting Physician Orthopedic Surgery  03/27/16   Oretha Milch, MD Consulting Physician Pulmonary Disease  03/27/16   Venancio Poisson, MD Consulting Physician Dermatology  03/27/16   Marcelle Overlie, MD Consulting Physician Obstetrics and Gynecology  03/27/16     Chief Complaint  Patient presents with  . URI    cough,congestion drainage x  3 week     Subjective: Pt presents for an OV with complaints of cough of 3 weeks duration.  Associated symptoms include sore throat, right ear pain, coughing all night, headache from coughing and lack of sleep.  She feels the last week it has worsened. She was treated for max sinusitis 4 weeks ago and feels she never fully recovered. She was treated with Augmentin at that time. She doesnt take daily antihistamine.  Her son was ill last week and she feels that is when her symptoms progressed.  Pt has tried many OTC cold and sinus to ease their symptoms.   Depression screen [facility] 2/9 09/14/2017 03/27/2016  Decreased Interest 0 0  Down, Depressed, Hopeless 0 0  PHQ - 2 Score 0 0    Allergies  Allergen Reactions  . Diflunisal Rash    ITCHING   Social History   Tobacco Use  . Smoking status: Never Smoker  . Smokeless tobacco: Never Used  Substance Use Topics  . Alcohol use: No   Past Medical History:  Diagnosis Date  . Acne 09/16/2012  . ADHD   . Anxiety   . Decreased libido 09/16/2012  . Insomnia 09/16/2012  . Preventative health care 09/16/2012  . Sun-damaged skin 09/16/2012   Past Surgical History:  Procedure Laterality Date  . ANTERIOR CRUCIATE LIGAMENT REPAIR  01-2004   right leg - replacement  . SKIN BIOPSY     multiple  one with 17 stitches, 1 precancer lesion  . WISDOM TOOTH EXTRACTION  1991   all 4   Family History  Problem Relation Age of Onset  . Arthritis Mother        hands  . Melanoma Maternal Grandmother   . Brain cancer Maternal Grandmother   . Heart attack Maternal Grandfather   . Alcohol abuse Maternal Grandfather   . Heart disease Maternal Grandfather   . Stroke Maternal Grandfather   . Kidney cancer Maternal Grandfather   . Lung cancer Maternal Grandfather   . Melanoma Paternal Grandmother   . Lung cancer Paternal Grandfather    Allergies as of 10/26/2017      Reactions   Diflunisal Rash   ITCHING      Medication List        Accurate as of 10/26/17 10:48 AM. Always use your most recent med list.          atomoxetine 40 MG capsule Commonly known as:  STRATTERA   desvenlafaxine 50 MG 24 hr tablet Commonly known as:  PRISTIQ Take 50 mg by mouth daily.   norethindrone-ethinyl estradiol 1-20 MG-MCG tablet Commonly known as:  JUNEL FE,GILDESS FE,LOESTRIN FE Take 1 tablet by mouth daily.       All past medical history, surgical history, allergies, family history, immunizations andmedications were updated in the EMR  today and reviewed under the history and medication portions of their EMR.     ROS: Negative, with the exception of above mentioned in HPI   Objective:  BP 120/85 (BP Location: Right Arm, Patient Position: Sitting, Cuff Size: Normal)   Pulse 77   Temp 99 F (37.2 C)   Resp 20   Ht 5' (1.524 m)   Wt 124 lb (56.2 kg)   LMP 10/18/2017   SpO2 98%   BMI 24.22 kg/m  Body mass index is 24.22 kg/m. Gen: Afebrile. No acute distress. Nontoxic in appearance, well developed, well nourished. Pleasant caucasian female.  HENT: AT. [place]. Bilateral TM visualized without erythema or buldging. MMM, no oral lesions. Bilateral nares with erythema and drainage. Throat without erythema or exudates. PND present, cough present. Hoarseness present.  Eyes:Pupils Equal Round  Reactive to light, Extraocular movements intact,  Conjunctiva without redness, discharge or icterus. Neck/lymp/endocrine: Supple,no lymphadenopathy CV: RRR  Chest: CTAB, no wheeze or crackles. Good air movement, normal resp effort.  Abd: Soft. NTND. BS present.  Skin: no rashes, purpura or petechiae.  Neuro:  Normal gait. PERLA. EOMi. Alert. Oriented x3   No exam data present No results found. No results found for this or any previous visit (from the past 24 hour(s)).  Assessment/Plan: Anna May is a 47 y.o. female present for OV for  Acute non-recurrent maxillary sinusitis Cough Rest, hydrate.  take xyzal or zyrtec every night.  +conitnue flonase, mucinex (DM if cough), nettie pot or nasal saline.   levaquin x7 days prescribed, take until completed. IM depo medrol provided today. Atrovent nasal spray, especially before bed. To help decrease secretions.  If cough present it can last up to 6-8 weeks. Hycodan cough syrup provided if needed. May cause sedation, there is a controlled substance in this medicine. No refills.  F/U 2 weeks of not improved.     Reviewed expectations re: course of current medical issues.  Discussed self-management of symptoms.  Outlined signs and symptoms indicating need for more acute intervention.  Patient verbalized understanding and all questions were answered.  Patient received an After-Visit Summary.    No orders of the defined types were placed in this encounter.    Note is dictated utilizing voice recognition software. Although note has been proof read prior to signing, occasional typographical errors still can be missed. If any questions arise, please do not hesitate to call for verification.   electronically signed by:  Felix Pacini, DO   [facility] - [place]

## 2017-10-26 NOTE — Patient Instructions (Signed)
Rest, hydrate.  take xyzal or zyrtec every night (antihistamine) +conitnue flonase, mucinex (DM if cough), nettie pot or nasal saline.   levaquin x7 days prescribed, take until completed.  Atrovent nasal spray, especially before bed. To help decrease secretions.  If cough present it can last up to 6-8 weeks. Hycodan cough syrup provided if needed. May cause sedation, there is a controlled substance in this medicine. No refills.  F/U 2 weeks of not improved.    Sinusitis, Adult Sinusitis is soreness and inflammation of your sinuses. Sinuses are hollow spaces in the bones around your face. They are located:  Around your eyes.  In the middle of your forehead.  Behind your nose.  In your cheekbones.  Your sinuses and nasal passages are lined with a stringy fluid (mucus). Mucus normally drains out of your sinuses. When your nasal tissues get inflamed or swollen, the mucus can get trapped or blocked so air cannot flow through your sinuses. This lets bacteria, viruses, and funguses grow, and that leads to infection. Follow these instructions at home: Medicines  Take, use, or apply over-the-counter and prescription medicines only as told by your doctor. These may include nasal sprays.  If you were prescribed an antibiotic medicine, take it as told by your doctor. Do not stop taking the antibiotic even if you start to feel better. Hydrate and Humidify  Drink enough water to keep your pee (urine) clear or pale yellow.  Use a cool mist humidifier to keep the humidity level in your home above 50%.  Breathe in steam for 10-15 minutes, 3-4 times a day or as told by your doctor. You can do this in the bathroom while a hot shower is running.  Try not to spend time in cool or dry air. Rest  Rest as much as possible.  Sleep with your head raised (elevated).  Make sure to get enough sleep each night. General instructions  Put a warm, moist washcloth on your face 3-4 times a day or as told  by your doctor. This will help with discomfort.  Wash your hands often with soap and water. If there is no soap and water, use hand sanitizer.  Do not smoke. Avoid being around people who are smoking (secondhand smoke).  Keep all follow-up visits as told by your doctor. This is important. Contact a doctor if:  You have a fever.  Your symptoms get worse.  Your symptoms do not get better within 10 days. Get help right away if:  You have a very bad headache.  You cannot stop throwing up (vomiting).  You have pain or swelling around your face or eyes.  You have trouble seeing.  You feel confused.  Your neck is stiff.  You have trouble breathing. This information is not intended to replace advice given to you by your health care provider. Make sure you discuss any questions you have with your health care provider. Document Released: 11/18/2007 Document Revised: 01/26/2016 Document Reviewed: 03/27/2015 Elsevier Interactive Patient Education  [employer].

## 2018-08-11 ENCOUNTER — Encounter: Payer: Self-pay | Admitting: Family Medicine

## 2018-08-11 ENCOUNTER — Ambulatory Visit: Payer: Managed Care, Other (non HMO) | Admitting: Family Medicine

## 2018-08-11 VITALS — BP 107/76 | HR 105 | Temp 99.0°F | Resp 20 | Ht 60.0 in | Wt 127.8 lb

## 2018-08-11 DIAGNOSIS — J111 Influenza due to unidentified influenza virus with other respiratory manifestations: Secondary | ICD-10-CM

## 2018-08-11 DIAGNOSIS — R69 Illness, unspecified: Secondary | ICD-10-CM | POA: Diagnosis not present

## 2018-08-11 DIAGNOSIS — J029 Acute pharyngitis, unspecified: Secondary | ICD-10-CM

## 2018-08-11 LAB — POCT RAPID STREP A (OFFICE): RAPID STREP A SCREEN: NEGATIVE

## 2018-08-11 LAB — POCT INFLUENZA A/B
INFLUENZA A, POC: NEGATIVE
INFLUENZA B, POC: NEGATIVE

## 2018-08-11 MED ORDER — HYDROCODONE-ACETAMINOPHEN 5-325 MG PO TABS: 1.0000 | ORAL_TABLET | Freq: Four times a day (QID) | ORAL | 0 refills | Status: DC | PRN

## 2018-08-11 NOTE — Progress Notes (Signed)
OFFICE VISIT  08/11/2018   CC:  Chief Complaint  Patient presents with  . Sore Throat   HPI:    Patient is a 48 y.o. Caucasian female who presents for respiratory complaints. Started with decreased appetite 3 d/a.  Some pnd 2 d/a. Yesterday she really started to feel "sick".  Sore and scratchy throat started yesterday, sharp pains in R ear, low back and hips achy, some dry/sharp cough.  Bad HA.  No neck stiffness.  No n/v/d or rash. Subjective F/C last night.  Tm today 99.5.   Past Medical History:  Diagnosis Date  . Acne 09/16/2012  . ADHD   . Anxiety   . Decreased libido 09/16/2012  . Insomnia 09/16/2012  . Preventative health care 09/16/2012  . Sun-damaged skin 09/16/2012    Past Surgical History:  Procedure Laterality Date  . ANTERIOR CRUCIATE LIGAMENT REPAIR  01-2004   right leg - replacement  . SKIN BIOPSY     multiple one with 17 stitches, 1 precancer lesion  . WISDOM TOOTH EXTRACTION  1991   all 4    Outpatient Medications Prior to Visit  Medication Sig Dispense Refill  . desvenlafaxine (PRISTIQ) 50 MG 24 hr tablet Take 50 mg by mouth daily.  12  . norethindrone-ethinyl estradiol (JUNEL FE,GILDESS FE,LOESTRIN FE) 1-20 MG-MCG tablet Take 1 tablet by mouth daily.    Marland Kitchen atomoxetine (STRATTERA) 40 MG capsule     . HYDROcodone-homatropine (HYCODAN) 5-1.5 MG/5ML syrup Take 5 mLs by mouth at bedtime. (Patient not taking: Reported on 08/11/2018) 120 mL 0  . ipratropium (ATROVENT) 0.06 % nasal spray Place 2 sprays into both nostrils 4 (four) times daily. (Patient not taking: Reported on 08/11/2018) 15 mL 12  . levofloxacin (LEVAQUIN) 500 MG tablet Take 1 tablet (500 mg total) by mouth daily. (Patient not taking: Reported on 08/11/2018) 7 tablet 0   No facility-administered medications prior to visit.     Allergies  Allergen Reactions  . Diflunisal Rash    ITCHING    ROS As per HPI  PE: Blood pressure 107/76, pulse (!) 105, temperature 99 F (37.2 C), temperature source  Oral, resp. rate 20, height 5' (1.524 m), weight 127 lb 12.8 oz (58 kg), SpO2 97 %. Gen: Alert, tired-appearing.  NAD/nontoxic.  Patient is oriented to person, place, time, and situation. AFFECT: pleasant, lucid thought and speech. ENT: Ears: EACs clear, normal epithelium.  TMs with good light reflex and landmarks bilaterally.  Eyes: no injection, icteris, swelling, or exudate.  EOMI, PERRLA. Nose: no drainage or turbinate edema/swelling.  No injection or focal lesion.  Mild R maxillary and diffuse frontal sinus region TTP.  Mouth: lips without lesion/swelling.  Oral mucosa pink and moist.  Dentition intact and without obvious caries or gingival swelling.  Oropharynx without erythema, exudate, or swelling.  Neck - No masses or thyromegaly or limitation in range of motion CV: RRR, no m/r/g.   LUNGS: CTA bilat, nonlabored resps, good aeration in all lung fields. EXT: no clubbing or cyanosis.  no edema.   LABS:    Chemistry      Component Value Date/Time   NA 136 05/16/2014 0918   K 4.4 05/16/2014 0918   CL 104 05/16/2014 0918   CO2 26 05/16/2014 0918   BUN 16 05/16/2014 0918   CREATININE 0.9 05/16/2014 0918      Component Value Date/Time   CALCIUM 9.2 05/16/2014 0918   ALKPHOS 45 05/16/2014 0918   AST 23 05/16/2014 0918   ALT 19  05/16/2014 0918   BILITOT 0.5 05/16/2014 0918     Rapid strep: NEG  Rapid flu A/B: NEG  IMPRESSION AND PLAN:  Influenza-like illness. No sign of bacterial infection. Symptomatic care--mainly for her HA and body aches-->vicodin 5/325, 1-2 q6h prn, #30. Therapeutic expectations and side effect profile of medication discussed today.  Patient's questions answered. Push fluids. Rest. Signs/symptoms to call or return for were reviewed and pt expressed understanding.  An After Visit Summary was printed and given to the patient.  FOLLOW UP: Return if symptoms worsen or fail to improve.  Signed:  Santiago Bumpers, MD           08/11/2018

## 2018-12-13 ENCOUNTER — Other Ambulatory Visit: Payer: Self-pay

## 2018-12-13 ENCOUNTER — Ambulatory Visit (INDEPENDENT_AMBULATORY_CARE_PROVIDER_SITE_OTHER): Payer: Managed Care, Other (non HMO) | Admitting: Family Medicine

## 2018-12-13 ENCOUNTER — Encounter: Payer: Self-pay | Admitting: Family Medicine

## 2018-12-13 DIAGNOSIS — M25512 Pain in left shoulder: Secondary | ICD-10-CM | POA: Diagnosis not present

## 2018-12-13 MED ORDER — TRAMADOL HCL 50 MG PO TABS: 50.0000 mg | ORAL_TABLET | Freq: Four times a day (QID) | ORAL | 0 refills | Status: DC | PRN

## 2018-12-13 NOTE — Progress Notes (Signed)
Anna May - 48 y.o. female MRN [id number]  Date of birth: Jun 10, 1971    SUBJECTIVE:      Chief Complaint: left shoulder pain  HPI:  48 year old female presents with left shoulder pain for several weeks.  She denies any specific injury.  She states that throughout the call the lockdown she has been exercising at home which involved shoulder exercises.  Her pain was first noticed while doing push-ups.  She also notes pain with doing shoulder exercises involving abduction and flexion.  Additionally, she notices increased pain if she leans on her left elbow.  She has been working with the physical therapist who referred her here today for concern of adhesive capsulitis.  She has been receiving medical massage, and dry needling have not been resolving her pain.  She did take a 2-week rest for activity without improvement.  Today, she reports pain with movement and nearly any direction primarily if trying to raise her hand overhead and is unable to move beyond 90 degrees..  She reports pain over the posterior aspect of her shoulder as well as over the anterior lateral portion.  She denies any numbness or tingling distally.  No erythema.   ROS:     See HPI. All other reviewed systems negative.  PERTINENT  PMH / PSH FH / / SH:  Past Medical, Surgical, Social, and Family History Reviewed & Updated in the EMR.   OBJECTIVE: There were no vitals taken for this visit.  Physical Exam:  Vital signs are reviewed.  GEN: Alert and oriented, NAD Pulm: Breathing unlabored PSY: normal mood, congruent affect  MSK: Left shoulder: No obvious deformity or asymmetry. No bruising. No swelling Tenderness over the greater tuberosity.  No tenderness over the biceps tendon.  No AC joint tenderness Patient has limited range of motion in flexion and abduction and unable to move beyond 90 degrees.  Her external rotation is to about 70 degrees on the left compared to the right which is about 100 degrees. NV  intact distally Special Tests:  - Impingement: Positive Hawkins and Neers.  - Supraspinatus: Pain with empty can.  4+ /5 strength due to pain - Infraspinatus/Teres: 5/5 strength with ER  MSK [place]: Limited ultrasound of the left shoulder performed.  No obvious rotator cuff tears within the supraspinatus or bursitis.  No biceps tenosynovitis or tears.  Dynamically, there is subacromial impingement with abduction.  There is fluid visualized within the bursa as well as a thickened bursal capsule.  Right shoulder:  AC joint No tenderness palpation Full range of motion without pain 5/5 strength with RTC testing  ASSESSMENT & PLAN:  1.  Left shoulder pain- based on patient's primary pain being in flexion and abduction movements as well as visualization of impingement with dynamic ultrasound.  I suspect the majority of her pain is due to impingement/subacromial bursitis.  However, she also has limited external rotation as well as pain at end range external rotation which is less expected with subacromial patient. -Discussed diagnostic and therapeutic benefits of injection today and decided to proceed with a subacromial bursa injection as described below. -She will continue with physical therapy as tolerated -Tramadol for severe pain -She will follow-up in several weeks if the injection not helpful and we can proceed with intra-articular injection.   Procedure performed: subacromial corticosteroid injection; ultrasound guided Consent obtained and verified. Time-out conducted. Noted no overlying erythema, induration, or other signs of local infection. The  left subacromial space was identified laterally with ultrasound guidance  and marked. The overlying skin was prepped in a sterile fashion. Topical analgesic spray: Ethyl chloride. Joint: Left subacromial Needle: 25GA, 1.5" Completed without difficulty. Meds: Depo-Medrol 40 mg, lidocaine 5 cc

## 2018-12-13 NOTE — Progress Notes (Signed)
I saw and examined the patient with Dr. Okey Dupre and agree with assessment and plan as outlined.    6 weeks of progressively worsening left shoulder pain.  Exam consistent with early adhesive capsulitis.  Ultrasound shows dynamic impingement with subacromial/subdeltoid bursitis.  Will try subacromial bursa injection followed by continued PT.  If still no improvement, then one-time intra-articular injection.

## 2018-12-14 ENCOUNTER — Telehealth: Payer: Self-pay | Admitting: Family Medicine

## 2018-12-14 NOTE — Telephone Encounter (Signed)
Patient left vm that she was told to call and ask for [facility] or [facility]. Wanted to schedule appt with Dr. Junius Roads. Unable to speak to patient voicemail was full.

## 2019-09-22 LAB — HM PAP SMEAR

## 2020-01-05 ENCOUNTER — Encounter: Payer: Self-pay | Admitting: Family Medicine

## 2020-01-05 ENCOUNTER — Other Ambulatory Visit: Payer: Self-pay

## 2020-01-05 ENCOUNTER — Ambulatory Visit (INDEPENDENT_AMBULATORY_CARE_PROVIDER_SITE_OTHER): Payer: Managed Care, Other (non HMO) | Admitting: Family Medicine

## 2020-01-05 VITALS — BP 138/89 | HR 84 | Temp 98.6°F | Ht 60.0 in | Wt 120.0 lb

## 2020-01-05 DIAGNOSIS — J3489 Other specified disorders of nose and nasal sinuses: Secondary | ICD-10-CM | POA: Diagnosis not present

## 2020-01-05 MED ORDER — DOXYCYCLINE HYCLATE 100 MG PO TABS: 100.0000 mg | ORAL_TABLET | Freq: Two times a day (BID) | ORAL | 0 refills | Status: DC

## 2020-01-05 MED ORDER — MUPIROCIN 2 % EX OINT: 1.0000 "application " | TOPICAL_OINTMENT | Freq: Two times a day (BID) | CUTANEOUS | 0 refills | Status: DC

## 2020-01-05 NOTE — Progress Notes (Signed)
This visit occurred during the SARS-CoV-2 public health emergency.  Safety protocols were in place, including screening questions prior to the visit, additional usage of staff PPE, and extensive cleaning of exam room while observing appropriate contact time as indicated for disinfecting solutions.    Anna May , 21-Jan-1971, 49 y.o., female MRN: [id number] Patient Care Team    Relationship Specialty Notifications Start End  Natalia Leatherwood, DO PCP - General Family Medicine  03/27/16   Laverta Baltimore, NP Nurse Practitioner Nurse Practitioner  03/27/16    Comment: GYN NP for Lovenia Shuck, MD Consulting Physician Orthopedic Surgery  03/27/16   Oretha Milch, MD Consulting Physician Pulmonary Disease  03/27/16   Venancio Poisson, MD Consulting Physician Dermatology  03/27/16   Marcelle Overlie, MD Consulting Physician Obstetrics and Gynecology  03/27/16   Beryle Flock, PT Physical Therapist Physical Therapy  12/13/18     Chief Complaint  Patient presents with   Facial Injury    nose red and tender to touch and swelling x 5 days      Subjective: Pt presents for an OV with complaints of had swollen area of nose of 5 days duration.  Associated symptoms include pain.  Patient reports the right tip of her nose became rather swollen, cherry-red and painful.  She has tried nasal saline, peroxide and Neosporin application to both the skin and nostril.  She denies fevers, chills, nausea or vomit.  She denies any bloody nose.  SHe has no history of MRSA infection.    Depression screen [facility] 2/9 01/05/2020 09/14/2017 03/27/2016  Decreased Interest 0 0 0  Down, Depressed, Hopeless 0 0 0  PHQ - 2 Score 0 0 0  Altered sleeping 0 - -  Tired, decreased energy 0 - -  Change in appetite 0 - -  Feeling bad or failure about yourself  0 - -  Trouble concentrating 0 - -  Moving slowly or fidgety/restless 0 - -  Suicidal thoughts 0 - -  PHQ-9 Score 0 - -  Difficult doing work/chores Not  difficult at all - -    Allergies  Allergen Reactions   Diflunisal Rash    ITCHING   Social History   Social History Narrative   Married to [place]more. They have 2 children Leonette Most, [place])   Economist, BS degree.    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    Takes a daily vitamin. Drinks caffeine, uses herbal remedies.    Exercises routinely.   Smoke detector in the home.    Feels safe in her relationships.    Past Medical History:  Diagnosis Date   Acne 09/16/2012   ADHD    Anxiety    Decreased libido 09/16/2012   Insomnia 09/16/2012   Preventative health care 09/16/2012   Sun-damaged skin 09/16/2012   Past Surgical History:  Procedure Laterality Date   ANTERIOR CRUCIATE LIGAMENT REPAIR  01-2004   right leg - replacement   SKIN BIOPSY     multiple one with 17 stitches, 1 precancer lesion   WISDOM TOOTH EXTRACTION  1991   all 4   Family History  Problem Relation Age of Onset   Arthritis Mother        hands   Melanoma Maternal Grandmother    Brain cancer Maternal Grandmother    Heart attack Maternal Grandfather    Alcohol abuse Maternal Grandfather    Heart disease Maternal Grandfather    Stroke Maternal Grandfather  Kidney cancer Maternal Grandfather    Lung cancer Maternal Grandfather    Melanoma Paternal Grandmother    Lung cancer Paternal Grandfather    Allergies as of 01/05/2020      Reactions   Diflunisal Rash   ITCHING      Medication List       Accurate as of January 05, 2020  5:57 PM. If you have any questions, ask your nurse or doctor.        STOP taking these medications   traMADol 50 MG tablet Commonly known as: ULTRAM Stopped by: Felix Pacini, DO     TAKE these medications   doxycycline 100 MG tablet Commonly known as: VIBRA-TABS Take 1 tablet (100 mg total) by mouth 2 (two) times daily. Started by: Felix Pacini, DO   ibuprofen 200 MG tablet Commonly known as: ADVIL Take 200 mg by mouth every 6 (six) hours as needed.    mupirocin ointment 2 % Commonly known as: Bactroban Apply 1 application topically 2 (two) times daily. Started by: Felix Pacini, DO   norethindrone-ethinyl estradiol 1-20 MG-MCG tablet Commonly known as: LOESTRIN FE Take 1 tablet by mouth daily.       All past medical history, surgical history, allergies, family history, immunizations andmedications were updated in the EMR today and reviewed under the history and medication portions of their EMR.     ROS: Negative, with the exception of above mentioned in HPI   Objective:  BP (!) 138/89    Pulse 84    Temp 98.6 F (37 C) (Temporal)    Ht 5' (1.524 m)    Wt 120 lb (54.4 kg)    SpO2 99%    BMI 23.44 kg/m  Body mass index is 23.44 kg/m. Gen: Afebrile. No acute distress. Nontoxic in appearance, well developed, well nourished.  HENT: AT. [place].  Right external bulb of nose with redness and swelling.  Right nostril with redness, tenderness and swelling.  No obvious abscess appreciated.  No nasal polyp. Eyes:Pupils Equal Round Reactive to light, Extraocular movements intact,  Conjunctiva without redness, discharge or icterus.  Neuro: Normal gait. PERLA. EOMi. Alert. Oriented x3   No exam data present No results found. No results found for this or any previous visit (from the past 24 hour(s)).  Assessment/Plan: Erin T Hosie is a 49 y.o. female present for OV for  Nasal vestibulitis: Patient with nasal vestibulitis signs and symptoms. She was encouraged to use nasal saline for cleansing followed by Bactroban ointment twice daily. Doxycycline twice daily prescribed. Follow-up in 2-4 weeks if not completely resolved, sooner if needed.   Reviewed expectations re: course of current medical issues.  Discussed self-management of symptoms.  Outlined signs and symptoms indicating need for more acute intervention.  Patient verbalized understanding and all questions were answered.  Patient received an After-Visit Summary.    No  orders of the defined types were placed in this encounter.  Meds ordered this encounter  Medications   doxycycline (VIBRA-TABS) 100 MG tablet    Sig: Take 1 tablet (100 mg total) by mouth 2 (two) times daily.    Dispense:  20 tablet    Refill:  0   mupirocin ointment (BACTROBAN) 2 %    Sig: Apply 1 application topically 2 (two) times daily.    Dispense:  22 g    Refill:  0   Referral Orders  No referral(s) requested today     Note is dictated utilizing voice recognition software. Although note has  been proof read prior to signing, occasional typographical errors still can be missed. If any questions arise, please do not hesitate to call for verification.   electronically signed by:  Felix Pacini, DO  [facility] - [place]

## 2020-01-05 NOTE — Patient Instructions (Signed)
Start the doxycyline every 12 hours . Use the ointment twice a day after you rinse with nasal saline.   If any symptoms remain after 3 weeks, follow up for recheck.

## 2020-01-12 ENCOUNTER — Telehealth: Payer: Self-pay

## 2020-01-12 NOTE — Telephone Encounter (Signed)
Patient would like to know if she can get the COVID vaccine while she is on the antibiotic that was prescribed last week.

## 2020-01-12 NOTE — Telephone Encounter (Signed)
Pt was called and given information.  

## 2020-01-12 NOTE — Telephone Encounter (Signed)
There is no influence or interaction between antibiotics and COVID-19 vaccines, antibiotics may be taken at any time relative to COVID-19 vaccine administration

## 2020-04-08 ENCOUNTER — Encounter: Payer: Self-pay | Admitting: Family Medicine

## 2020-04-08 ENCOUNTER — Other Ambulatory Visit: Payer: Self-pay

## 2020-04-08 ENCOUNTER — Ambulatory Visit (INDEPENDENT_AMBULATORY_CARE_PROVIDER_SITE_OTHER): Payer: BC Managed Care – PPO | Admitting: Family Medicine

## 2020-04-08 VITALS — BP 126/76 | HR 85 | Temp 98.5°F | Ht 60.0 in | Wt 123.0 lb

## 2020-04-08 DIAGNOSIS — R142 Eructation: Secondary | ICD-10-CM | POA: Diagnosis not present

## 2020-04-08 DIAGNOSIS — Z23 Encounter for immunization: Secondary | ICD-10-CM | POA: Diagnosis not present

## 2020-04-08 DIAGNOSIS — Z1211 Encounter for screening for malignant neoplasm of colon: Secondary | ICD-10-CM

## 2020-04-08 DIAGNOSIS — R1013 Epigastric pain: Secondary | ICD-10-CM

## 2020-04-08 MED ORDER — SUCRALFATE 1 G PO TABS: 1.0000 g | ORAL_TABLET | Freq: Three times a day (TID) | ORAL | 0 refills | Status: DC

## 2020-04-08 MED ORDER — PANTOPRAZOLE SODIUM 40 MG PO TBEC: 40.0000 mg | DELAYED_RELEASE_TABLET | Freq: Every day | ORAL | 0 refills | Status: DC

## 2020-04-08 NOTE — Patient Instructions (Addendum)
Small meals for the next few weeks  Try to avoid triggers if able to identify one.   Start protonix daily for 2 weeks and Carafate before meals (30 minutes).  Miralax 1 cap daily in 8 ounces of water.

## 2020-04-08 NOTE — Progress Notes (Signed)
This visit occurred during the SARS-CoV-2 public health emergency.  Safety protocols were in place, including screening questions prior to the visit, additional usage of staff PPE, and extensive cleaning of exam room while observing appropriate contact time as indicated for disinfecting solutions.    Anna May , [birth year], 49 y.o., female MRN: [id number] Patient Care Team    Relationship Specialty Notifications Start End  Natalia Leatherwood, DO PCP - General Family Medicine  03/27/16   Laverta Baltimore, NP Nurse Practitioner Nurse Practitioner  03/27/16    Comment: GYN NP for Lovenia Shuck, MD Consulting Physician Orthopedic Surgery  03/27/16   Oretha Milch, MD Consulting Physician Pulmonary Disease  03/27/16   Venancio Poisson, MD Consulting Physician Dermatology  03/27/16   Marcelle Overlie, MD Consulting Physician Obstetrics and Gynecology  03/27/16   Beryle Flock, PT Physical Therapist Physical Therapy  12/13/18     Chief Complaint  Patient presents with  . Dysphagia    pt c/o painful dsyphagia while eating and excessive eructation throughout the day x 5 days     Subjective: Pt presents for an OV with complaints of pain of lower esophagus/upper epigastric region with increased belching for a little over a week. Patient reports she did start a probiotic around that time and had significant constipation. She stopped the probiotic 3 days ago and since that time she has had multiple very large/long bowel movements. The belching has since stopped. The sharpness of the pain in her epigastric region has improved, but she still having some mild discomfort when food passes to her stomach. She denies heartburn. She denies feeling his food is getting stuck in her esophagus. She does not have a known history of reflux or hiatal hernia. She does not routinely take NSAIDs or alcohol consumption.  Depression screen [facility] 2/9 04/08/2020 01/05/2020 09/14/2017 03/27/2016  Decreased  Interest 0 0 0 0  Down, Depressed, Hopeless 0 0 0 0  PHQ - 2 Score 0 0 0 0  Altered sleeping - 0 - -  Tired, decreased energy - 0 - -  Change in appetite - 0 - -  Feeling bad or failure about yourself  - 0 - -  Trouble concentrating - 0 - -  Moving slowly or fidgety/restless - 0 - -  Suicidal thoughts - 0 - -  PHQ-9 Score - 0 - -  Difficult doing work/chores - Not difficult at all - -    Allergies  Allergen Reactions  . Diflunisal Rash    ITCHING   Social History   Social History Narrative   Married to Schulter. They have 2 children Leonette Most, [place])   Economist, BS degree.    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    Takes a daily vitamin. Drinks caffeine, uses herbal remedies.    Exercises routinely.   Smoke detector in the home.    Feels safe in her relationships.    Past Medical History:  Diagnosis Date  . Acne 09/16/2012  . ADHD   . Anxiety   . Decreased libido 09/16/2012  . Insomnia 09/16/2012  . Preventative health care 09/16/2012  . Sun-damaged skin 09/16/2012   Past Surgical History:  Procedure Laterality Date  . ANTERIOR CRUCIATE LIGAMENT REPAIR  01-2004   right leg - replacement  . SKIN BIOPSY     multiple one with 17 stitches, 1 precancer lesion  . WISDOM TOOTH EXTRACTION  1991   all 4   Family History  Problem Relation Age of Onset  . Arthritis Mother        hands  . Melanoma Maternal Grandmother   . Brain cancer Maternal Grandmother   . Heart attack Maternal Grandfather   . Alcohol abuse Maternal Grandfather   . Heart disease Maternal Grandfather   . Stroke Maternal Grandfather   . Kidney cancer Maternal Grandfather   . Lung cancer Maternal Grandfather   . Melanoma Paternal Grandmother   . Lung cancer Paternal Grandfather    Allergies as of 04/08/2020      Reactions   Diflunisal Rash   ITCHING      Medication List       Accurate as of April 08, 2020 12:23 PM. If you have any questions, ask your nurse or doctor.        STOP taking these  medications   doxycycline 100 MG tablet Commonly known as: VIBRA-TABS Stopped by: Felix Pacini, DO   ibuprofen 200 MG tablet Commonly known as: ADVIL Stopped by: Felix Pacini, DO   mupirocin ointment 2 % Commonly known as: Bactroban Stopped by: Felix Pacini, DO   terconazole 0.8 % vaginal cream Commonly known as: TERAZOL 3 Stopped by: Felix Pacini, DO     TAKE these medications   norethindrone-ethinyl estradiol 1-20 MG-MCG tablet Commonly known as: LOESTRIN FE Take 1 tablet by mouth daily.   pantoprazole 40 MG tablet Commonly known as: PROTONIX Take 1 tablet (40 mg total) by mouth daily. Started by: Felix Pacini, DO   sucralfate 1 g tablet Commonly known as: Carafate Take 1 tablet (1 g total) by mouth 4 (four) times daily -  with meals and at bedtime. Started by: Felix Pacini, DO       All past medical history, surgical history, allergies, family history, immunizations andmedications were updated in the EMR today and reviewed under the history and medication portions of their EMR.     ROS: Negative, with the exception of above mentioned in HPI   Objective:  BP 126/76   Pulse 85   Temp 98.5 F (36.9 C) (Oral)   Ht 5' (1.524 m)   Wt 123 lb (55.8 kg)   SpO2 99%   BMI 24.02 kg/m  Body mass index is 24.02 kg/m. Gen: Afebrile. No acute distress. Nontoxic in appearance, well developed, well nourished.  HENT: AT. [place]. No cough. No shortness of breath.  Eyes:Pupils Equal Round Reactive to light, Extraocular movements intact,  Conjunctiva without redness, discharge or icterus. CV: RRR Chest: CTAB  Abd: Soft. Flat. Mild tenderness midline epigastric region. ND. BS present. No masses palpated. No rebound or guarding. .  Neuro:  Normal gait. PERLA. EOMi. Alert. Oriented x3  Psych: Normal affect, dress and demeanor. Normal speech. Normal thought content and judgment.  No exam data present No results found. No results found for this or any previous visit (from the past  24 hour(s)).  Assessment/Plan: Anna May is a 49 y.o. female present for OV for  Colon cancer screening Referred today for establishment. She is overdue for screening colonoscopy. May also need gastroenterology for acute issue, if not improving. - Ambulatory referral for gastroenterology  Need for influenza vaccination Influenza vaccine administered  Epigastric pain/Belching She is mildly tender midline epigastric just below xiphoid process today. She is seeing mild improvement in her symptoms since stopping the probiotic and with time. Symptoms may be caused by probiotic versus pill esophagitis/gastritis versus small hiatal hernia. Discussed different possible etiologies with her today. Recommend small meals for  the next few weeks. Attempt to avoid triggers if one is identified-currently is not. Start Protonix daily for 2-4 weeks Start Carafate 30 minutes prior to meals for 2-4 weeks MiraLAX taper discussed to keep bowels soft but formed. Follow-up in 4 weeks if not established with GI and symptoms have not resolved. Sooner if worsening.   Reviewed expectations re: course of current medical issues.  Discussed self-management of symptoms.  Outlined signs and symptoms indicating need for more acute intervention.  Patient verbalized understanding and all questions were answered.  Patient received an After-Visit Summary.    Orders Placed This Encounter  Procedures  . Flu Vaccine QUAD 6+ mos PF IM (Fluarix Quad PF)  . Ambulatory referral to Gastroenterology   Meds ordered this encounter  Medications  . pantoprazole (PROTONIX) 40 MG tablet    Sig: Take 1 tablet (40 mg total) by mouth daily.    Dispense:  30 tablet    Refill:  0  . sucralfate (CARAFATE) 1 g tablet    Sig: Take 1 tablet (1 g total) by mouth 4 (four) times daily -  with meals and at bedtime.    Dispense:  120 tablet    Refill:  0    Referral Orders     Ambulatory referral to  Gastroenterology   Note is dictated utilizing voice recognition software. Although note has been proof read prior to signing, occasional typographical errors still can be missed. If any questions arise, please do not hesitate to call for verification.   electronically signed by:  Felix Pacini, DO  [facility] [place]

## 2020-04-30 ENCOUNTER — Other Ambulatory Visit: Payer: Self-pay | Admitting: Family Medicine

## 2020-05-06 ENCOUNTER — Ambulatory Visit: Payer: BC Managed Care – PPO | Admitting: Family Medicine

## 2020-05-28 ENCOUNTER — Other Ambulatory Visit: Payer: Self-pay

## 2020-05-28 MED ORDER — PANTOPRAZOLE SODIUM 40 MG PO TBEC
40.0000 mg | DELAYED_RELEASE_TABLET | Freq: Every day | ORAL | 0 refills | Status: DC
Start: 2020-05-28 — End: 2021-07-29

## 2020-06-13 ENCOUNTER — Telehealth (INDEPENDENT_AMBULATORY_CARE_PROVIDER_SITE_OTHER): Payer: BC Managed Care – PPO | Admitting: Family Medicine

## 2020-06-13 ENCOUNTER — Encounter: Payer: Self-pay | Admitting: Family Medicine

## 2020-06-13 ENCOUNTER — Other Ambulatory Visit: Payer: Self-pay

## 2020-06-13 DIAGNOSIS — Z20822 Contact with and (suspected) exposure to covid-19: Secondary | ICD-10-CM

## 2020-06-13 NOTE — Progress Notes (Signed)
   Virtual Visit via Video   I connected with patient on 06/13/20 at 11:30 AM EST by a video enabled telemedicine application and verified that I am speaking with the correct person using two identifiers.  Location patient: Home Location provider: [facility], Office Persons participating in the virtual visit: Patient, Provider, CMA (Katie N)  I discussed the limitations of evaluation and management by telemedicine and the availability of in person appointments. The patient expressed understanding and agreed to proceed.  Subjective:   HPI:   URI- 'i'm feeling just weird'.  + low grade fever that started Tuesday night.  Tm 100.7.  + body aches.  Denies HA.  Mild cough, mild nasal drainage.  Denies excessive fatigue.  Denies sinus pain/pressure.  Denies night sweats.  No direct sick contacts but peripheral contacts have had COVID.  Started Vit C, Vit D, Zinc yesterday.  ROS:   See pertinent positives and negatives per HPI.  Patient Active Problem List   Diagnosis Date Noted  . Nasal vestibulitis 01/05/2020  . Attention deficit hyperactivity disorder (ADHD), combined type 03/27/2016  . Anxiety state 03/27/2016  . Visit for preventive health examination 05/16/2014    Social History   Tobacco Use  . Smoking status: Never Smoker  . Smokeless tobacco: Never Used  Substance Use Topics  . Alcohol use: No    Current Outpatient Medications:  .  norethindrone-ethinyl estradiol (JUNEL FE,GILDESS FE,LOESTRIN FE) 1-20 MG-MCG tablet, Take 1 tablet by mouth daily., Disp: , Rfl:  .  pantoprazole (PROTONIX) 40 MG tablet, Take 1 tablet (40 mg total) by mouth daily. (Patient not taking: Reported on 06/13/2020), Disp: 30 tablet, Rfl: 0 .  sucralfate (CARAFATE) 1 g tablet, Take 1 tablet (1 g total) by mouth 4 (four) times daily -  with meals and at bedtime. (Patient not taking: Reported on 06/13/2020), Disp: 120 tablet, Rfl: 0  Allergies  Allergen Reactions  . Diflunisal Rash     ITCHING    Objective:   There were no vitals taken for this visit.  AAOx3, NAD NCAT, EOMI No obvious CN deficits Coloring WNL + dry cough Pt is able to speak clearly, coherently without shortness of breath or increased work of breathing.  Thought process is linear.  Mood is appropriate.   Assessment and Plan:   Suspected COVID- new.  Pt's sxs are consistent w/ COVID or other viral URI.  She has not been tested recently but knows others that have had similar sxs and tested +.  She is trying to stay away from her family as much as possible but reports they have been going about things 'business as usual'.  Discussed testing options.  Reviewed supportive care- fluids, rest, tylenol/ibuprofen, Mucinex DM, Vit D, Vit C, and Zinc.  Reviewed red flags that should prompt immediate medical attention.  Pt expressed understanding and is in agreement w/ plan.    Neena Rhymes, MD 06/13/2020

## 2020-08-27 ENCOUNTER — Encounter: Payer: Self-pay | Admitting: Gastroenterology

## 2020-09-16 ENCOUNTER — Encounter: Payer: Self-pay | Admitting: Family Medicine

## 2020-09-16 ENCOUNTER — Other Ambulatory Visit: Payer: Self-pay

## 2020-09-16 ENCOUNTER — Telehealth (INDEPENDENT_AMBULATORY_CARE_PROVIDER_SITE_OTHER): Payer: BC Managed Care – PPO | Admitting: Family Medicine

## 2020-09-16 VITALS — Temp 101.8°F

## 2020-09-16 DIAGNOSIS — R6889 Other general symptoms and signs: Secondary | ICD-10-CM

## 2020-09-16 MED ORDER — OSELTAMIVIR PHOSPHATE 75 MG PO CAPS: 75.0000 mg | ORAL_CAPSULE | Freq: Two times a day (BID) | ORAL | 0 refills | Status: AC

## 2020-09-16 MED ORDER — PREDNISONE 20 MG PO TABS: 40.0000 mg | ORAL_TABLET | Freq: Every day | ORAL | 0 refills | Status: AC

## 2020-09-16 MED ORDER — ONDANSETRON 4 MG PO TBDP: 4.0000 mg | ORAL_TABLET | Freq: Three times a day (TID) | ORAL | 0 refills | Status: DC | PRN

## 2020-09-16 NOTE — Progress Notes (Signed)
Chief Complaint  Patient presents with  . Fever  . Cough  . Sinus Problem  . Generalized Body Aches    Anna May here for URI complaints. Due to COVID-19 pandemic, we are interacting via web portal for an electronic face-to-face visit. I verified patient's ID using 2 identifiers. Patient agreed to proceed with visit via this method. Patient is at home, I am at office. Patient and I are present for visit.   Duration: 1 day  Associated symptoms: Fever (101.8 F), sinus congestion, rhinorrhea, ear fullness, myalgia and cough Denies: sinus pain, itchy watery eyes, ear pain, ear drainage, sore throat, wheezing, shortness of breath and N/V/D Treatment to date: none Sick contacts: Yes- son who also developed nausea/vomiting later in his course; Flu A has been going around in his school  Past Medical History:  Diagnosis Date  . Acne 09/16/2012  . ADHD   . Anxiety   . Decreased libido 09/16/2012  . Insomnia 09/16/2012  . Preventative health care 09/16/2012  . Sun-damaged skin 09/16/2012    Temp (!) 101.8 F (38.8 C) (Oral)  No conversational dyspnea Age appropriate judgment and insight Nml affect and mood  Flu-like symptoms - Plan: predniSONE (DELTASONE) 20 MG tablet, oseltamivir (TAMIFLU) 75 MG capsule, ondansetron (ZOFRAN-ODT) 4 MG disintegrating tablet  If no improvement in next 3-4 days, get covid tested.  Continue to push fluids, practice good hand hygiene, cover mouth when coughing. F/u prn. If starting to experience worsening s/s's, shaking, or shortness of breath, seek immediate care. Pt voiced understanding and agreement to the plan.  Jilda Roche [place], DO 09/16/20 1:55 PM

## 2020-10-09 ENCOUNTER — Ambulatory Visit (AMBULATORY_SURGERY_CENTER): Payer: Self-pay

## 2020-10-09 ENCOUNTER — Other Ambulatory Visit: Payer: Self-pay

## 2020-10-09 VITALS — Ht 60.0 in | Wt 122.0 lb

## 2020-10-09 DIAGNOSIS — Z1211 Encounter for screening for malignant neoplasm of colon: Secondary | ICD-10-CM

## 2020-10-09 NOTE — Progress Notes (Signed)
No allergies to soy or egg Pt is not on blood thinners or diet pills Denies issues with sedation/intubation Denies atrial flutter/fib Denies constipation   Emmi instructions given to pt  Pt is aware of Covid safety and care partner requirements.  

## 2020-10-21 ENCOUNTER — Encounter: Payer: Self-pay | Admitting: Gastroenterology

## 2020-10-23 ENCOUNTER — Encounter: Payer: Self-pay | Admitting: Gastroenterology

## 2020-10-23 ENCOUNTER — Ambulatory Visit (AMBULATORY_SURGERY_CENTER): Payer: BC Managed Care – PPO | Admitting: Gastroenterology

## 2020-10-23 ENCOUNTER — Other Ambulatory Visit: Payer: Self-pay

## 2020-10-23 VITALS — BP 123/84 | HR 77 | Temp 98.2°F | Resp 22 | Ht 60.0 in | Wt 122.0 lb

## 2020-10-23 DIAGNOSIS — Z1211 Encounter for screening for malignant neoplasm of colon: Secondary | ICD-10-CM

## 2020-10-23 MED ORDER — SODIUM CHLORIDE 0.9 % IV SOLN: 500.0000 mL | Freq: Once | INTRAVENOUS | Status: DC

## 2020-10-23 NOTE — Patient Instructions (Signed)
Please read handouts provided. Continue present medications. Repeat colonoscopy in 10 years for screening purposes.    YOU HAD AN ENDOSCOPIC PROCEDURE TODAY AT THE [facility]:   Refer to the procedure report that was given to you for any specific questions about what was found during the examination.  If the procedure report does not answer your questions, please call your gastroenterologist to clarify.  If you requested that your care partner not be given the details of your procedure findings, then the procedure report has been included in a sealed envelope for you to review at your convenience later.  YOU SHOULD EXPECT: Some feelings of bloating in the abdomen. Passage of more gas than usual.  Walking can help get rid of the air that was put into your GI tract during the procedure and reduce the bloating. If you had a lower endoscopy (such as a colonoscopy or flexible sigmoidoscopy) you may notice spotting of blood in your stool or on the toilet paper. If you underwent a bowel prep for your procedure, you may not have a normal bowel movement for a few days.  Please Note:  You might notice some irritation and congestion in your nose or some drainage.  This is from the oxygen used during your procedure.  There is no need for concern and it should clear up in a day or so.  SYMPTOMS TO REPORT IMMEDIATELY:   Following lower endoscopy (colonoscopy or flexible sigmoidoscopy):  Excessive amounts of blood in the stool  Significant tenderness or worsening of abdominal pains  Swelling of the abdomen that is new, acute  Fever of 100F or higher   For urgent or emergent issues, a gastroenterologist can be reached at any hour by calling [phone]. Do not use MyChart messaging for urgent concerns.    DIET:  We do recommend a small meal at first, but then you may proceed to your regular diet.  Drink plenty of fluids but you should avoid alcoholic beverages for 24 hours.  ACTIVITY:   You should plan to take it easy for the rest of today and you should NOT DRIVE or use heavy machinery until tomorrow (because of the sedation medicines used during the test).    FOLLOW UP: Our staff will call the number listed on your records 48-72 hours following your procedure to check on you and address any questions or concerns that you may have regarding the information given to you following your procedure. If we do not reach you, we will leave a message.  We will attempt to reach you two times.  During this call, we will ask if you have developed any symptoms of COVID 19. If you develop any symptoms (ie: fever, flu-like symptoms, shortness of breath, cough etc.) before then, please call [phone].  If you test positive for Covid 19 in the 2 weeks post procedure, please call and report this information to us.    If any biopsies were taken you will be contacted by phone or by letter within the next 1-3 weeks.  Please call us at [phone] if you have not heard about the biopsies in 3 weeks.    SIGNATURES/CONFIDENTIALITY: You and/or your care partner have signed paperwork which will be entered into your electronic medical record.  These signatures attest to the fact that that the information above on your After Visit Summary has been reviewed and is understood.  Full responsibility of the confidentiality of this discharge information lies with you and/or your care-partner. 

## 2020-10-23 NOTE — Op Note (Signed)
[facility] Patient Name: Anna May Procedure Date: 10/23/2020 10:54 AM MRN: [id number] Endoscopist: Napoleon Form , MD Age: 50 Referring MD:  Date of Birth: [birth year] Gender: Female Account #: [id number] Procedure:                Colonoscopy Indications:              Screening for colorectal malignant neoplasm Medicines:                Monitored Anesthesia Care Procedure:                Pre-Anesthesia Assessment:                           - Prior to the procedure, a History and Physical                            was performed, and patient medications and                            allergies were reviewed. The patient's tolerance of                            previous anesthesia was also reviewed. The risks                            and benefits of the procedure and the sedation                            options and risks were discussed with the patient.                            All questions were answered, and informed consent                            was obtained. Prior Anticoagulants: The patient has                            taken no previous anticoagulant or antiplatelet                            agents. ASA Grade Assessment: I - A normal, healthy                            patient. After reviewing the risks and benefits,                            the patient was deemed in satisfactory condition to                            undergo the procedure.                           After obtaining informed consent, the colonoscope  was passed under direct vision. Throughout the                            procedure, the patient's blood pressure, pulse, and                            oxygen saturations were monitored continuously. The                            #[id number] was introduced through the anus and                            advanced to the the cecum, identified by                            appendiceal orifice and ileocecal  valve. The                            colonoscopy was performed without difficulty. The                            patient tolerated the procedure well. The quality                            of the bowel preparation was excellent. The                            ileocecal valve, appendiceal orifice, and rectum                            were photographed. Scope In: 10:59:46 AM Scope Out: 11:18:38 AM Scope Withdrawal Time: 0 hours 12 minutes 12 seconds  Total Procedure Duration: 0 hours 18 minutes 52 seconds  Findings:                 The perianal and digital rectal examinations were                            normal.                           Non-bleeding internal hemorrhoids were found during                            retroflexion. The hemorrhoids were large.                           The exam was otherwise without abnormality. Complications:            No immediate complications. Estimated Blood Loss:     Estimated blood loss was minimal. Impression:               - Non-bleeding internal hemorrhoids.                           - The examination was otherwise normal.                           -  No specimens collected. Recommendation:           - Patient has a contact number available for                            emergencies. The signs and symptoms of potential                            delayed complications were discussed with the                            patient. Return to normal activities tomorrow.                            Written discharge instructions were provided to the                            patient.                           - Resume previous diet.                           - Continue present medications.                           - Repeat colonoscopy in 10 years for screening                            purposes. Napoleon Form, MD 10/23/2020 11:28:11 AM This report has been signed electronically.

## 2020-10-23 NOTE — Progress Notes (Signed)
To PACU, VSS. Report to [email or web address] 

## 2020-10-23 NOTE — Progress Notes (Signed)
Pt's states no medical or surgical changes since previsit or office visit.  CHECK-IN-AER  VITAL SIGNS-CW 

## 2020-10-25 ENCOUNTER — Telehealth: Payer: Self-pay

## 2020-10-25 NOTE — Telephone Encounter (Signed)
  Follow up Call-  Call back number 10/23/2020  Post procedure Call Back phone  # [phone]  Permission to leave phone message Yes  Some recent data might be hidden     Patient questions:  Do you have a fever, pain , or abdominal swelling? No. Pain Score  0 *  Have you tolerated food without any problems? Yes.    Have you been able to return to your normal activities? Yes.    Do you have any questions about your discharge instructions: Diet   No. Medications  No. Follow up visit  No.  Do you have questions or concerns about your Care? No.  Actions: * If pain score is 4 or above: No action needed, pain <4.   1. Have you developed a fever since your procedure? No   2.   Have you had an respiratory symptoms (SOB or cough) since your procedure? No   3.   Have you tested positive for COVID 19 since your procedure? No   4.   Have you had any family members/close contacts diagnosed with the COVID 19 since your procedure?  No    If yes to any of these questions please route to Laverna Peace, RN and Karlton Lemon, RN

## 2020-12-11 LAB — HM MAMMOGRAPHY

## 2021-07-28 ENCOUNTER — Telehealth: Payer: Self-pay

## 2021-07-28 NOTE — Telephone Encounter (Signed)
Pt sched to see PCP

## 2021-07-28 NOTE — Telephone Encounter (Signed)
[facility] Day - Client TELEPHONE ADVICE RECORD Access[employer]urse Patient Name: MILLICEN T Wester Gender: Female DOB: Oct 13, 1970 Age: 51 Y 5 M 11 D Return Phone Number: [phone] (Primary) Address: City/ State/ Zip: [place]  [address] Client [facility] Day - Client Client Site [facility] - Day Provider Claiborne [place], [place] Contact Type Call Who Is Calling Patient / Member / Family / Caregiver Call Type Triage / Clinical Relationship To Patient Self Return Phone Number [phone] (Primary) Chief Complaint Coughing Up Blood Reason for Call Symptomatic / Request for Health Information Initial Comment Caller states has covid last week and still has a bad cough. Location confirmed. Caller wants a steroid called in. Coughing up blood. No longer covid positive. Additional Comment exhausted search was done. Translation No Guidelines Guideline Title Affirmed Question Affirmed Notes Nurse Date/Time Lamount Cohen Time) COVID-19 - Persisting Symptoms Follow-up Call [1] PERSISTING SYMPTOMS OF COVID-19 AND [2] symptoms Neita Goodnight, RN, Glee Arvin 07/25/2021 9:37:11 PM Disp. Time Lamount Cohen Time) Disposition Final User 07/25/2021 9:39:23 PM See PCP within 24 Hours Yes Milta Deiters, RN, Glee Arvin Caller Disagree/Comply Comply Caller Understands Yes PreDisposition Go to Urgent Care/Walk-In Clinic Care Advice Given Per Guideline SEE PCP WITHIN 24 HOURS: CALL BACK IF: * You become worse CARE ADVICE given per COVID-19 - Persisting Symptoms Follow-Up Call (Adult) guideline. Referrals GO TO FACILITY UNDECIDED

## 2021-07-29 ENCOUNTER — Other Ambulatory Visit: Payer: Self-pay

## 2021-07-29 ENCOUNTER — Encounter: Payer: Self-pay | Admitting: Family Medicine

## 2021-07-29 ENCOUNTER — Ambulatory Visit (INDEPENDENT_AMBULATORY_CARE_PROVIDER_SITE_OTHER): Payer: 59 | Admitting: Family Medicine

## 2021-07-29 VITALS — BP 116/79 | HR 81 | Temp 98.1°F | Ht 60.0 in | Wt 123.0 lb

## 2021-07-29 DIAGNOSIS — J329 Chronic sinusitis, unspecified: Secondary | ICD-10-CM | POA: Diagnosis not present

## 2021-07-29 DIAGNOSIS — B9689 Other specified bacterial agents as the cause of diseases classified elsewhere: Secondary | ICD-10-CM | POA: Diagnosis not present

## 2021-07-29 DIAGNOSIS — U071 COVID-19: Secondary | ICD-10-CM

## 2021-07-29 MED ORDER — HYDROCODONE BIT-HOMATROP MBR 5-1.5 MG/5ML PO SOLN: 5.0000 mL | Freq: Three times a day (TID) | ORAL | 0 refills | Status: DC | PRN

## 2021-07-29 MED ORDER — AMOXICILLIN-POT CLAVULANATE 875-125 MG PO TABS: 1.0000 | ORAL_TABLET | Freq: Two times a day (BID) | ORAL | 0 refills | Status: DC

## 2021-07-29 MED ORDER — PREDNISONE 20 MG PO TABS: 40.0000 mg | ORAL_TABLET | Freq: Every day | ORAL | 0 refills | Status: DC

## 2021-07-29 NOTE — Patient Instructions (Signed)

## 2021-07-29 NOTE — Progress Notes (Signed)
This visit occurred during the SARS-CoV-2 public health emergency.  Safety protocols were in place, including screening questions prior to the visit, additional usage of staff PPE, and extensive cleaning of exam room while observing appropriate contact time as indicated for disinfecting solutions.    Anna May , 10-Aug-1970, 51 y.o., female MRN: [id number] Patient Care Team    Relationship Specialty Notifications Start End  Natalia Leatherwood, DO PCP - General Family Medicine  03/27/16   Laverta Baltimore, NP Nurse Practitioner Nurse Practitioner  03/27/16    Comment: GYN NP for Lovenia Shuck, MD Consulting Physician Orthopedic Surgery  03/27/16   Oretha Milch, MD Consulting Physician Pulmonary Disease  03/27/16   Venancio Poisson, MD Consulting Physician Dermatology  03/27/16   Marcelle Overlie, MD Consulting Physician Obstetrics and Gynecology  03/27/16   Beryle Flock, PT Physical Therapist Physical Therapy  12/13/18     Chief Complaint  Patient presents with   Cough    Pt c/o cough, post nasal drip, and nasal congestion x 4 weeks; pt tested post for COVID 07/16/21;      Subjective: Pt presents for an OV with complaints of upper respiratory symptoms of 4 weeks in duration since testing positive for COVID February 1. Pt has tried mucinex dm, robitussin, ibf, benadryl and tessalon Perles  to ease their symptoms.   Depression screen [facility] 2/9 07/29/2021 04/08/2020 01/05/2020 09/14/2017 03/27/2016  Decreased Interest 0 0 0 0 0  Down, Depressed, Hopeless 0 0 0 0 0  PHQ - 2 Score 0 0 0 0 0  Altered sleeping - - 0 - -  Tired, decreased energy - - 0 - -  Change in appetite - - 0 - -  Feeling bad or failure about yourself  - - 0 - -  Trouble concentrating - - 0 - -  Moving slowly or fidgety/restless - - 0 - -  Suicidal thoughts - - 0 - -  PHQ-9 Score - - 0 - -  Difficult doing work/chores - - Not difficult at all - -    Allergies  Allergen Reactions   Diflunisal Rash     ITCHING   Social History   Social History Narrative   Married to Sattley. They have 2 children Leonette Most, [place])   Economist, BS degree.    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    Takes a daily vitamin. Drinks caffeine, uses herbal remedies.    Exercises routinely.   Smoke detector in the home.    Feels safe in her relationships.    Past Medical History:  Diagnosis Date   Acne 09/16/2012   ADHD    Anxiety    Decreased libido 09/16/2012   Insomnia 09/16/2012   Preventative health care 09/16/2012   Sun-damaged skin 09/16/2012   Past Surgical History:  Procedure Laterality Date   ANTERIOR CRUCIATE LIGAMENT REPAIR  01-2004   right leg - replacement   SKIN BIOPSY     multiple one with 17 stitches, 1 precancer lesion   WISDOM TOOTH EXTRACTION  1991   all 4   Family History  Problem Relation Age of Onset   Arthritis Mother        hands   Melanoma Maternal Grandmother    Brain cancer Maternal Grandmother    Heart attack Maternal Grandfather    Alcohol abuse Maternal Grandfather    Heart disease Maternal Grandfather    Stroke Maternal Grandfather    Kidney cancer Maternal  Grandfather    Lung cancer Maternal Grandfather    Melanoma Paternal Grandmother    Lung cancer Paternal Grandfather    Colon polyps Neg Hx    Colon cancer Neg Hx    Esophageal cancer Neg Hx    Stomach cancer Neg Hx    Rectal cancer Neg Hx    Allergies as of 07/29/2021       Reactions   Diflunisal Rash   ITCHING        Medication List        Accurate as of July 29, 2021  9:06 AM. If you have any questions, ask your nurse or doctor.          STOP taking these medications    ondansetron 4 MG disintegrating tablet Commonly known as: ZOFRAN-ODT Stopped by: Felix Pacini, DO   pantoprazole 40 MG tablet Commonly known as: PROTONIX Stopped by: Felix Pacini, DO   sucralfate 1 g tablet Commonly known as: Carafate Stopped by: Felix Pacini, DO       TAKE these medications     amoxicillin-clavulanate 875-125 MG tablet Commonly known as: AUGMENTIN Take 1 tablet by mouth 2 (two) times daily. Started by: Felix Pacini, DO   HYDROcodone bit-homatropine 5-1.5 MG/5ML syrup Commonly known as: HYCODAN Take 5 mLs by mouth every 8 (eight) hours as needed for cough. Started by: Felix Pacini, DO   norethindrone-ethinyl estradiol-FE 1-20 MG-MCG tablet Commonly known as: LOESTRIN FE Take 1 tablet by mouth daily.   predniSONE 20 MG tablet Commonly known as: DELTASONE Take 2 tablets (40 mg total) by mouth daily with breakfast. Started by: Felix Pacini, DO        All past medical history, surgical history, allergies, family history, immunizations andmedications were updated in the EMR today and reviewed under the history and medication portions of their EMR.     Review of Systems  Constitutional:  Positive for fever.  HENT:  Positive for congestion, ear pain and sinus pain.   Eyes:  Negative for discharge and redness.  Respiratory:  Positive for cough. Negative for hemoptysis, sputum production, shortness of breath and wheezing.   Gastrointestinal:  Negative for diarrhea, nausea and vomiting.  Musculoskeletal:  Positive for myalgias (Anterior thighs).  Skin:  Negative for rash.  Neurological:  Positive for dizziness and headaches.  Negative, with the exception of above mentioned in HPI   Objective:  BP 116/79    Pulse 81    Temp 98.1 F (36.7 C) (Oral)    Ht 5' (1.524 m)    Wt 123 lb (55.8 kg)    LMP 06/30/2021    SpO2 97%    BMI 24.02 kg/m  Body mass index is 24.02 kg/m. Physical Exam Vitals and nursing note reviewed.  Constitutional:      General: She is not in acute distress.    Appearance: Normal appearance. She is normal weight. She is not ill-appearing or toxic-appearing.  HENT:     Head: Normocephalic and atraumatic.     Right Ear: Ear canal and external ear normal. There is no impacted cerumen.     Left Ear: Ear canal and external ear normal.  There is no impacted cerumen.     Ears:     Comments: No erythema bilaterally.  Bilateral effusions present.    Nose: Congestion and rhinorrhea present.     Mouth/Throat:     Mouth: Mucous membranes are moist.     Pharynx: No oropharyngeal exudate or posterior oropharyngeal erythema.  Eyes:  General: No scleral icterus.       Right eye: No discharge.        Left eye: No discharge.     Extraocular Movements: Extraocular movements intact.     Conjunctiva/sclera: Conjunctivae normal.     Pupils: Pupils are equal, round, and reactive to light.  Cardiovascular:     Rate and Rhythm: Normal rate and regular rhythm.     Heart sounds: No murmur heard.   No gallop.  Pulmonary:     Effort: Pulmonary effort is normal. No respiratory distress.     Breath sounds: Normal breath sounds. No wheezing or rales.     Comments: Cough present Musculoskeletal:     Right lower leg: No edema.     Left lower leg: No edema.  Skin:    Findings: No rash.  Neurological:     Mental Status: She is alert and oriented to person, place, and time. Mental status is at baseline.  Psychiatric:        Mood and Affect: Mood normal.        Behavior: Behavior normal.        Thought Content: Thought content normal.        Judgment: Judgment normal.     No results found. No results found. No results found for this or any previous visit (from the past 24 hour(s)).  Assessment/Plan: Anna May is a 51 y.o. female present for OV for  Bacterial sinusitis/COVID-19 Rest, hydrate.  +/- flonase, mucinex (DM if cough), nettie pot or nasal saline.  Augmentin twice daily x10 days and prednisone x5 days prescribed, take until completed.  Hycodan cough syrup to help with cough during sleeping hours. If cough present it can last up to 6-8 weeks.  F/U 2 weeks if not improved.    Reviewed expectations re: course of current medical issues. Discussed self-management of symptoms. Outlined signs and symptoms  indicating need for more acute intervention. Patient verbalized understanding and all questions were answered. Patient received an After-Visit Summary.    No orders of the defined types were placed in this encounter.  Meds ordered this encounter  Medications   amoxicillin-clavulanate (AUGMENTIN) 875-125 MG tablet    Sig: Take 1 tablet by mouth 2 (two) times daily.    Dispense:  20 tablet    Refill:  0   predniSONE (DELTASONE) 20 MG tablet    Sig: Take 2 tablets (40 mg total) by mouth daily with breakfast.    Dispense:  10 tablet    Refill:  0   HYDROcodone bit-homatropine (HYCODAN) 5-1.5 MG/5ML syrup    Sig: Take 5 mLs by mouth every 8 (eight) hours as needed for cough.    Dispense:  120 mL    Refill:  0   Referral Orders  No referral(s) requested today     Note is dictated utilizing voice recognition software. Although note has been proof read prior to signing, occasional typographical errors still can be missed. If any questions arise, please do not hesitate to call for verification.   electronically signed by:  Felix Pacini, DO  [facility] [place]

## 2021-07-30 ENCOUNTER — Ambulatory Visit: Payer: BC Managed Care – PPO | Admitting: Family Medicine

## 2021-08-04 ENCOUNTER — Telehealth: Payer: Self-pay | Admitting: Family Medicine

## 2021-08-04 ENCOUNTER — Other Ambulatory Visit: Payer: Self-pay | Admitting: Family Medicine

## 2021-08-04 NOTE — Telephone Encounter (Signed)
LVM for pt to CB regarding rx request.   Note; Bacterial sinusitis/COVID-19 Rest, hydrate.  +/- flonase, mucinex (DM if cough), nettie pot or nasal saline.  Augmentin twice daily x10 days and prednisone x5 days prescribed, take until completed.  Hycodan cough syrup to help with cough during sleeping hours. If cough present it can last up to 6-8 weeks.  F/U 2 weeks if not improved.

## 2021-08-04 NOTE — Telephone Encounter (Signed)
Pt was seen last week, said she was given 5 day supply for the prednisone. Pt wanting to know can the supply for this medication  be extended?  Is there a different cough medicine that provider can recommend to patient. That can relax her cough?   Pt cell: [phone]  predniSONE predniSONE (DELTASONE) 20 MG tablet   [id number] - [place], [place] - [address] Phone:  [phone]  Fax:  [phone]

## 2021-08-04 NOTE — Telephone Encounter (Signed)
Spoke with pt and informed her the following from last OV.

## 2021-08-14 ENCOUNTER — Telehealth: Payer: Self-pay

## 2021-08-14 NOTE — Telephone Encounter (Signed)
Patient not quite over COVID cough from 2-3 weeks ago.  Patient was seen on 2/14 by Dr. Claiborne Billings. ?Patient is requesting another round of prednisone.  She stated that medication did help her with cough,etc ?She is not quite over cough, and needs some relief. ?Time of call today is 4:55PPM, office closes in 5 minutes.  I told patient I would have Dr. Alan Ripper CMA follow up with her tomorrow on refill approval. ?Patient can be reached [phone]. ? ?[facility] [address] ? ?predniSONE (DELTASONE) 20 MG tablet [[id number]]  ? ?

## 2021-08-15 MED ORDER — PREDNISONE 20 MG PO TABS
40.0000 mg | ORAL_TABLET | Freq: Every day | ORAL | 0 refills | Status: DC
Start: 2021-08-15 — End: 2023-08-13

## 2021-08-15 NOTE — Telephone Encounter (Signed)
Please advise 

## 2021-08-15 NOTE — Telephone Encounter (Signed)
LVM for pt to inform pt that rx has been sent.  ?

## 2021-08-15 NOTE — Telephone Encounter (Signed)
Did go ahead and call in the prednisone for her.  If needing any additional assistance we would need to see her back for follow-up to reevaluate her. ?Thanks ?

## 2021-08-20 ENCOUNTER — Telehealth: Payer: Self-pay | Admitting: Family Medicine

## 2021-08-20 NOTE — Telephone Encounter (Signed)
FYI: ? ?Pt called said she is still having same symptoms from last visit back on 07/30/21. ?the nasal stoppage on right side, coughing, sinus infection, fluid in right ear.  ? ?Pt wondering can she get a stronger dosage on antibiotics. She said was informed from last visit. That's if nothing helps, to call back & someone would speak to her.  ? ?I offered to schedule pt for OV, pt refused.  ?Pt cell: [phone] ?

## 2021-08-20 NOTE — Telephone Encounter (Signed)
Please advise 

## 2021-08-20 NOTE — Telephone Encounter (Signed)
Please return patient's call ?If she feels she needs additional antibiotic, then we need to reevaluate her.  The cough and ear congestion can last up to 8 weeks after illness or COVID, and not be infectious.  Antibiotics would not help improve symptoms. ? ?The only time antibiotic is going to help improve symptoms, is if she is infectious.  She was prescribed Augmentin, which is a strong antibiotic and would treat pneumonia and sinusitis from bacteria.  I suspect the symptoms she is feeling now is the inflammatory process created during her illness, but cannot be certain without evaluating her. ? ? ?I would encourage her to make sure she is taking over-the-counter antihistamine and start Flonase nasal spray if she is not already using. ? ? ?

## 2021-08-21 NOTE — Telephone Encounter (Signed)
FYI: ? ?Pt called in regards to her same symptoms. ?Informed pt that if she's needing additional antibiotics, she would need to be reevaluated. ? ?Offered to schedule pt for appt, pt declined. ?Pt decided to go to Urgent Care. ?

## 2021-08-21 NOTE — Telephone Encounter (Signed)
Noted  

## 2023-08-13 ENCOUNTER — Ambulatory Visit: Payer: 59 | Admitting: Family

## 2023-08-13 ENCOUNTER — Other Ambulatory Visit (HOSPITAL_BASED_OUTPATIENT_CLINIC_OR_DEPARTMENT_OTHER): Payer: Self-pay

## 2023-08-13 ENCOUNTER — Encounter: Payer: Self-pay | Admitting: Family

## 2023-08-13 ENCOUNTER — Ambulatory Visit: Payer: Self-pay | Admitting: Family Medicine

## 2023-08-13 VITALS — BP 130/64 | HR 86 | Ht 60.0 in | Wt 120.6 lb

## 2023-08-13 DIAGNOSIS — J9801 Acute bronchospasm: Secondary | ICD-10-CM | POA: Diagnosis not present

## 2023-08-13 DIAGNOSIS — J019 Acute sinusitis, unspecified: Secondary | ICD-10-CM

## 2023-08-13 MED ORDER — HYDROCODONE BIT-HOMATROP MBR 5-1.5 MG/5ML PO SOLN
5.0000 mL | Freq: Three times a day (TID) | ORAL | 0 refills | Status: DC | PRN
  Filled 2023-08-13: qty 120, 8d supply, fill #0

## 2023-08-13 MED ORDER — PREDNISONE 20 MG PO TABS
20.0000 mg | ORAL_TABLET | Freq: Every day | ORAL | 0 refills | Status: DC
  Filled 2023-08-13: qty 5, 5d supply, fill #0

## 2023-08-13 MED ORDER — DOXYCYCLINE HYCLATE 100 MG PO TABS
100.0000 mg | ORAL_TABLET | Freq: Two times a day (BID) | ORAL | 0 refills | Status: DC
  Filled 2023-08-13: qty 14, 7d supply, fill #0

## 2023-08-13 NOTE — Telephone Encounter (Signed)
 Noted.

## 2023-08-13 NOTE — Progress Notes (Signed)
 Anna May is a 53 y.o. 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 14 tablet 0   HYDROcodone bit-homatropine (HYCODAN) 5-1.5 MG/5ML syrup Take 5 mLs by mouth every 8 (eight) hours as needed for cough. 120 mL 0   norethindrone-ethinyl estradiol (JUNEL FE,GILDESS FE,LOESTRIN FE) 1-20 MG-MCG tablet Take 1 tablet by mouth daily.     predniSONE (DELTASONE) 20 MG tablet Take 1 tablet (20 mg total) by mouth daily with breakfast. 5 tablet 0   No current facility-administered medications for this visit.    Allergies: Diflunisal  Past Medical History:  Diagnosis Date   Acne 09/16/2012   ADHD    Anxiety    Decreased libido 09/16/2012   Insomnia 09/16/2012   Preventative health care 09/16/2012   Sun-damaged skin 09/16/2012    Past Surgical History:  Procedure Laterality Date   ANTERIOR CRUCIATE LIGAMENT REPAIR  01-2004   right leg - replacement   SKIN BIOPSY     multiple one with 17 stitches, 1 precancer lesion   WISDOM TOOTH EXTRACTION  1991   all 4    Family History  Problem Relation Age of Onset   Arthritis Mother        hands   Melanoma Maternal Grandmother    Brain cancer Maternal Grandmother    Heart attack Maternal Grandfather    Alcohol abuse Maternal Grandfather    Heart disease Maternal Grandfather    Stroke Maternal Grandfather    Kidney cancer Maternal Grandfather    Lung cancer Maternal Grandfather    Melanoma Paternal Grandmother    Lung cancer Paternal Grandfather    Colon polyps Neg Hx    Colon cancer Neg Hx    Esophageal cancer Neg Hx    Stomach  cancer Neg Hx    Rectal cancer Neg Hx     Social History   Tobacco Use   Smoking status: Never   Smokeless tobacco: Never  Substance Use Topics   Alcohol use: No    Subjective:   Seen as [employer] work in today- her PCP was unavailable today; 2 week history of cough/ congestion- concerned about persisting barking cough/ sinus pain and pressure;  No chest pain, shortness of breath;   Objective:  Vitals:   08/13/23 1540  BP: 130/64  Pulse: 86  SpO2: 100%  Weight: 120 lb 9.6 oz (54.7 kg)  Height: 5' (1.524 m)    General: Well developed, well nourished, in no acute distress  Skin : Warm and dry.  Head: Normocephalic and atraumatic  Eyes: Sclera and conjunctiva clear; pupils round and reactive to light; extraocular movements intact  Ears: External normal; canals clear; tympanic membranes normal  Oropharynx: Pink, supple. No suspicious lesions  Neck: Supple without thyromegaly, adenopathy  Lungs: Respirations unlabored; clear to auscultation bilaterally without wheeze, rales, rhonchi  CVS exam: normal rate and regular rhythm.  Neurologic: Alert and oriented; speech intact; face symmetrical; moves all extremities well; CNII-XII intact without focal deficit   Assessment:  1. Acute sinusitis, recurrence not specified, unspecified location   2. Acute bronchospasm     Plan:  Rx for Doxycyline 100 mg bid x 7 days, Rx for Prednisone 20 mg every day x 5 days and Hycodan to use as night; increase fluids, rest and follow up worse, no better.   No follow-ups on file.  No orders of the defined types were placed in this encounter.   Requested Prescriptions   Signed Prescriptions Disp Refills   doxycycline (VIBRA-TABS) 100 MG tablet 14 tablet 0    Sig: Take 1 tablet (100 mg total) by mouth 2 (two) times daily.   predniSONE (DELTASONE) 20 MG tablet 5 tablet 0    Sig: Take 1 tablet (20 mg total) by mouth daily with breakfast.   HYDROcodone bit-homatropine (HYCODAN) 5-1.5 MG/5ML syrup 120 mL  0    Sig: Take 5 mLs by mouth every 8 (eight) hours as needed for cough.

## 2023-08-13 NOTE — Telephone Encounter (Signed)
  Chief Complaint: Cough Symptoms: dry continuous cough, increased mucus-COVID positive 07/24/2023 Frequency: 2 weeks Pertinent Negatives: Patient denies fever, CP, SOB Disposition: [] ED /[] Urgent Care (no appt availability in office) / [x] Appointment(In office/virtual)/ []  [place] [facility]/ [] Home Care/ [] Refused Recommended Disposition /[] [facility]/ []  Follow-up with PCP Additional Notes: patient calling with continuous cough that is nonproductive along with increased mucus that patient is unable to get rid of. Patient was COVID positive 07/24/2023. Patient states she has a history of PNA and concerned with her symptoms. Patient states she is unable to go short periods of time without coughing. Patient coughing throughout phone call with this RN. Per protocol, appointment is the recommendation and to be seen within 24 hours. Appointment at PCP office not available until Monday. Patient okay with going to another office. Appointment made for today, 08/13/2023 at 3:40 pm at alternative location. Patient given information for the office. Patient verbalized understanding of plan and all questions answered.     Copied from CRM [id number]. Topic: Appointments - Appointment Scheduling >> Aug 13, 2023 10:28 AM Alcus Dad wrote: Patient had covid a few weeks ago and cough has not left. It is getting worse and patient thinks it has lead to pneumonia. Patient stated that she has increased mucus that she keeps swallowing. Reason for Disposition  [1] Continuous (nonstop) coughing interferes with work or school AND [2] no improvement using cough treatment per Care Advice  Answer Assessment - Initial Assessment Questions 1. ONSET: "When did the cough begin?"      Cough started 2.5 weeks 2. SEVERITY: "How bad is the cough today?"      Frequency of the cough is 10, productive of cough is 2 out of 10 3. SPUTUM: "Describe the color of your sputum" (none, dry cough; clear, white, yellow, green)      Dry 4. HEMOPTYSIS: "Are you coughing up any blood?" If so ask: "How much?" (flecks, streaks, tablespoons, etc.)     No 5. DIFFICULTY BREATHING: "Are you having difficulty breathing?" If Yes, ask: "How bad is it?" (e.g., mild, moderate, severe)    - MILD: No SOB at rest, mild SOB with walking, speaks normally in sentences, can lie down, no retractions, pulse < 100.    - MODERATE: SOB at rest, SOB with minimal exertion and prefers to sit, cannot lie down flat, speaks in phrases, mild retractions, audible wheezing, pulse 100-120.    - SEVERE: Very SOB at rest, speaks in single words, struggling to breathe, sitting hunched forward, retractions, pulse > 120      No 6. FEVER: "Do you have a fever?" If Yes, ask: "What is your temperature, how was it measured, and when did it start?"     No 7. CARDIAC HISTORY: "Do you have any history of heart disease?" (e.g., heart attack, congestive heart failure)      no 8. LUNG HISTORY: "Do you have any history of lung disease?"  (e.g., pulmonary embolus, asthma, emphysema)     no 9. PE RISK FACTORS: "Do you have a history of blood clots?" (or: recent major surgery, recent prolonged travel, bedridden)     No 10. OTHER SYMPTOMS: "Do you have any other symptoms?" (e.g., runny nose, wheezing, chest pain)       No 12. TRAVEL: "Have you traveled out of the country in the last month?" (e.g., travel history, exposures)       No  Protocols used: Cough - Acute Productive-A-AH

## 2023-09-08 ENCOUNTER — Ambulatory Visit (INDEPENDENT_AMBULATORY_CARE_PROVIDER_SITE_OTHER): Admitting: Urgent Care

## 2023-09-08 ENCOUNTER — Other Ambulatory Visit (HOSPITAL_BASED_OUTPATIENT_CLINIC_OR_DEPARTMENT_OTHER): Payer: Self-pay

## 2023-09-08 VITALS — BP 138/80 | HR 72 | Temp 98.2°F | Wt 125.0 lb

## 2023-09-08 DIAGNOSIS — J22 Unspecified acute lower respiratory infection: Secondary | ICD-10-CM

## 2023-09-08 DIAGNOSIS — U071 COVID-19: Secondary | ICD-10-CM | POA: Diagnosis not present

## 2023-09-08 MED ORDER — DEXAMETHASONE 6 MG PO TABS
6.0000 mg | ORAL_TABLET | Freq: Every day | ORAL | 0 refills | Status: DC
Start: 2023-09-08 — End: 2023-09-08

## 2023-09-08 MED ORDER — PROMETHAZINE-DM 6.25-15 MG/5ML PO SYRP
5.0000 mL | ORAL_SOLUTION | Freq: Every evening | ORAL | 0 refills | Status: DC | PRN
  Filled 2023-09-08 (×2): qty 60, 12d supply, fill #0

## 2023-09-08 MED ORDER — PROMETHAZINE-DM 6.25-15 MG/5ML PO SYRP: 5.0000 mL | ORAL_SOLUTION | Freq: Every evening | ORAL | 0 refills | Status: DC | PRN

## 2023-09-08 MED ORDER — AZITHROMYCIN 250 MG PO TABS
ORAL_TABLET | ORAL | 0 refills | Status: AC
  Filled 2023-09-08 (×2): qty 6, 5d supply, fill #0
  Filled ????-??-??: fill #0

## 2023-09-08 MED ORDER — DEXAMETHASONE 4 MG PO TABS
6.0000 mg | ORAL_TABLET | Freq: Every day | ORAL | 0 refills | Status: AC
  Filled 2023-09-08: qty 5, 5d supply, fill #0
  Filled 2023-09-08: qty 8, 5d supply, fill #0

## 2023-09-08 MED ORDER — MONTELUKAST SODIUM 10 MG PO TABS: 10.0000 mg | ORAL_TABLET | Freq: Every day | ORAL | 0 refills | Status: DC

## 2023-09-08 MED ORDER — AZITHROMYCIN 250 MG PO TABS: ORAL_TABLET | ORAL | 0 refills | Status: DC

## 2023-09-08 MED ORDER — MONTELUKAST SODIUM 10 MG PO TABS
10.0000 mg | ORAL_TABLET | Freq: Every day | ORAL | 0 refills | Status: DC
  Filled 2023-09-08 (×2): qty 14, 14d supply, fill #0

## 2023-09-08 NOTE — Patient Instructions (Signed)
 You have a lower respiratory tract infection. Please start taking the antibiotic azithromycin per package directions. Continue taking xyzal once daily to help clear up the mucous. Also start taking montelukast nightly. Start decadron every morning with breakfast. It is also recommended that you use nasal saline/ sinus washes to cleans the sinus passages. Spray until it comes out the other nostril. Hot steam from a shower or vaporizer may also be beneficial to help open up the upper airway. Eucalyptus can be helpful. I have called in an as needed cough medication to take at night-time only. Contact our office if any symptoms persist >10 days from now.

## 2023-09-08 NOTE — Progress Notes (Unsigned)
   Established Patient Office Visit  Subjective:  Patient ID: Anna May, female    DOB: 21-Apr-1971  Age: 53 y.o. MRN: [id number]  Chief Complaint  Patient presents with   Cough    Pt has had a lingering cough for about 7 weeks now and is concerned for pneumonia. She still has sinus drainage at the cough is worse at night.    HPI  {History (Optional):23778}  ROS: as noted in HPI  Objective:     BP 138/80   Pulse 72   Temp 98.2 F (36.8 C) (Oral)   Wt 125 lb (56.7 kg)   LMP 06/22/2023 (Exact Date)   SpO2 99%   BMI 24.41 kg/m  {Vitals History (Optional):23777}  Physical Exam   No results found for any visits on 09/08/23.  {Labs (Optional):23779}  The ASCVD Risk score (Arnett DK, et al., 2019) failed to calculate for the following reasons:   Cannot find a previous HDL lab   Cannot find a previous total cholesterol lab  Assessment & Plan:  There are no diagnoses linked to this encounter.   No follow-ups on file.   Maretta Bees, PA

## 2023-09-09 ENCOUNTER — Encounter: Payer: Self-pay | Admitting: Urgent Care

## 2023-09-20 ENCOUNTER — Encounter: Payer: Self-pay | Admitting: Family Medicine

## 2023-09-20 ENCOUNTER — Ambulatory Visit (INDEPENDENT_AMBULATORY_CARE_PROVIDER_SITE_OTHER): Admitting: Family Medicine

## 2023-09-20 VITALS — BP 118/84 | HR 67 | Temp 98.2°F | Wt 125.6 lb

## 2023-09-20 DIAGNOSIS — J301 Allergic rhinitis due to pollen: Secondary | ICD-10-CM | POA: Insufficient documentation

## 2023-09-20 DIAGNOSIS — K219 Gastro-esophageal reflux disease without esophagitis: Secondary | ICD-10-CM | POA: Insufficient documentation

## 2023-09-20 MED ORDER — IPRATROPIUM BROMIDE 0.06 % NA SOLN: 2.0000 | Freq: Four times a day (QID) | NASAL | 12 refills | Status: AC

## 2023-09-20 MED ORDER — OMEPRAZOLE 40 MG PO CPDR: 40.0000 mg | DELAYED_RELEASE_CAPSULE | Freq: Every day | ORAL | 1 refills | Status: DC

## 2023-09-20 MED ORDER — MONTELUKAST SODIUM 10 MG PO TABS: 10.0000 mg | ORAL_TABLET | Freq: Every day | ORAL | 1 refills | Status: AC

## 2023-09-20 NOTE — Patient Instructions (Signed)

## 2023-09-20 NOTE — Progress Notes (Signed)
 Anna May , 1971-03-01, 53 y.o., female MRN: [id number] Patient Care Team    Relationship Specialty Notifications Start End  Natalia Leatherwood, DO PCP - General Family Medicine  03/27/16   Laverta Baltimore, NP Nurse Practitioner Nurse Practitioner  03/27/16    Comment: GYN NP for Lovenia Shuck, MD Consulting Physician Orthopedic Surgery  03/27/16   Oretha Milch, MD Consulting Physician Pulmonary Disease  03/27/16   Venancio Poisson, MD Consulting Physician Dermatology  03/27/16   Marcelle Overlie, MD Consulting Physician Obstetrics and Gynecology  03/27/16   Beryle Flock, PT Physical Therapist Physical Therapy  12/13/18     Chief Complaint  Patient presents with   Cough    Ongoing for 2 months; persistent after having COVID & FLU. Pt was seen 3/26 by Whitney was prescribed antibiotic; still c/o cough, drainage. Pt requesting chest x-ray. Denies SOB, wheezing.      Subjective: Anna May is a 53 y.o. Pt presents for an OV with complaints persistent cough after illness that started in February in which she had influenza and COVID shortly thereafter.  She has been treated with doxycycline, prednisone and then seen again with persistence of the symptoms and treated with azithromycin and steroid.  Patient reports her cough is still persistent with hoarseness/strain in her voice.  She denies any additional fevers or chills. She does not endorse shortness of breath. She endorses a postnasal drip, cough that is worse at night when laying down and after meals.    08/13/2023    3:43 PM 07/29/2021    8:35 AM 04/08/2020   10:44 AM 01/05/2020    3:58 PM 09/14/2017    9:28 AM  Depression screen PHQ 2/9  Decreased Interest 0 0 0 0 0  Down, Depressed, Hopeless 0 0 0 0 0  PHQ - 2 Score 0 0 0 0 0  Altered sleeping    0   Tired, decreased energy    0   Change in appetite    0   Feeling bad or failure about yourself     0   Trouble concentrating    0   Moving slowly or  fidgety/restless    0   Suicidal thoughts    0   PHQ-9 Score    0   Difficult doing work/chores    Not difficult at all     Allergies  Allergen Reactions   Diflunisal Rash    ITCHING   Social History   Social History Narrative   Married to [place]. They have 2 children Leonette Most, [place])   Economist, BS degree.    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    Takes a daily vitamin. Drinks caffeine, uses herbal remedies.    Exercises routinely.   Smoke detector in the home.    Feels safe in her relationships.    Past Medical History:  Diagnosis Date   Acne 09/16/2012   ADHD    Anxiety    Decreased libido 09/16/2012   Insomnia 09/16/2012   Preventative health care 09/16/2012   Sun-damaged skin 09/16/2012   Past Surgical History:  Procedure Laterality Date   ANTERIOR CRUCIATE LIGAMENT REPAIR  01-2004   right leg - replacement   SKIN BIOPSY     multiple one with 17 stitches, 1 precancer lesion   WISDOM TOOTH EXTRACTION  1991   all 4   Family History  Problem Relation Age of Onset   Arthritis Mother  hands   Melanoma Maternal Grandmother    Brain cancer Maternal Grandmother    Heart attack Maternal Grandfather    Alcohol abuse Maternal Grandfather    Heart disease Maternal Grandfather    Stroke Maternal Grandfather    Kidney cancer Maternal Grandfather    Lung cancer Maternal Grandfather    Melanoma Paternal Grandmother    Lung cancer Paternal Grandfather    Colon polyps Neg Hx    Colon cancer Neg Hx    Esophageal cancer Neg Hx    Stomach cancer Neg Hx    Rectal cancer Neg Hx    Allergies as of 09/20/2023       Reactions   Diflunisal Rash   ITCHING        Medication List        Accurate as of September 20, 2023 11:48 AM. If you have any questions, ask your nurse or doctor.          STOP taking these medications    doxycycline 100 MG tablet Commonly known as: VIBRA-TABS Stopped by: Felix Pacini   promethazine-dextromethorphan 6.25-15 MG/5ML  syrup Commonly known as: PROMETHAZINE-DM Stopped by: Felix Pacini       TAKE these medications    montelukast 10 MG tablet Commonly known as: Singulair Take 1 tablet (10 mg total) by mouth at bedtime.   norethindrone-ethinyl estradiol-FE 1-20 MG-MCG tablet Commonly known as: LOESTRIN FE Take 1 tablet by mouth daily.        All past medical history, surgical history, allergies, family history, immunizations andmedications were updated in the EMR today and reviewed under the history and medication portions of their EMR.     ROS Negative, with the exception of above mentioned in HPI   Objective:  BP 118/84   Pulse 67   Temp 98.2 F (36.8 C)   Wt 125 lb 9.6 oz (57 kg)   SpO2 97%   BMI 24.53 kg/m  Body mass index is 24.53 kg/m. Physical Exam Vitals and nursing note reviewed.  Constitutional:      General: She is not in acute distress.    Appearance: Normal appearance. She is not ill-appearing, toxic-appearing or diaphoretic.  HENT:     Head: Normocephalic and atraumatic.     Right Ear: Tympanic membrane and ear canal normal.     Left Ear: Tympanic membrane and ear canal normal.     Nose: Rhinorrhea present. No congestion.     Comments: PND    Mouth/Throat:     Mouth: Mucous membranes are moist.     Pharynx: No oropharyngeal exudate or posterior oropharyngeal erythema.  Eyes:     General: No scleral icterus.       Right eye: No discharge.        Left eye: No discharge.     Extraocular Movements: Extraocular movements intact.     Conjunctiva/sclera: Conjunctivae normal.     Pupils: Pupils are equal, round, and reactive to light.  Cardiovascular:     Rate and Rhythm: Normal rate and regular rhythm.  Pulmonary:     Effort: Pulmonary effort is normal. No respiratory distress.     Breath sounds: Normal breath sounds. No wheezing, rhonchi or rales.     Comments: Persistent bronchospasm throat clearing cough Musculoskeletal:     Cervical back: Neck supple. No  tenderness.     Right lower leg: No edema.     Left lower leg: No edema.  Lymphadenopathy:     Cervical: No cervical adenopathy.  Skin:    General: Skin is warm.     Findings: No rash.  Neurological:     Mental Status: She is alert and oriented to person, place, and time. Mental status is at baseline.     Motor: No weakness.     Gait: Gait normal.  Psychiatric:        Mood and Affect: Mood normal.        Behavior: Behavior normal.        Thought Content: Thought content normal.        Judgment: Judgment normal.     No results found. No results found. No results found for this or any previous visit (from the past 24 hours).  Assessment/Plan: Yusra SHARENE KRIKORIAN is a 53 y.o. female present for OV for  Laryngopharyngeal reflux (LPR) (Primary) Patient's persistent throat clearing type of cough seems to be more consistent with LPR.  We discussed the irritation from the illness and cough, then creates a LPR inflammation that again creates a cough. Start omeprazole 40 mg daily for 4 weeks, if symptoms quickly return then extend course to 3 months. Avoid acidic content foods or spicy foods during this time.  Seasonal allergic rhinitis due to pollen Postnasal drip is present on exam today.  Lung exam is clear. Suspect her persistent cough is a combination of LPR and postnasal drip, creating irritation of her throat. Continue antihistamine of choice. Continue Singulair Start Atrovent nasal spray 4 times daily Start the omeprazole daily - montelukast (SINGULAIR) 10 MG tablet; Take 1 tablet (10 mg total) by mouth at bedtime.  Dispense: 90 tablet; Refill: 1 Patient advised that this treatment plan can take few weeks to a month to see improvement in symptoms.  Reviewed expectations re: course of current medical issues. Discussed self-management of symptoms. Outlined signs and symptoms indicating need for more acute intervention. Patient verbalized understanding and all questions were  answered. Patient received an After-Visit Summary.    No orders of the defined types were placed in this encounter.  No orders of the defined types were placed in this encounter.  Referral Orders  No referral(s) requested today     Note is dictated utilizing voice recognition software. Although note has been proof read prior to signing, occasional typographical errors still can be missed. If any questions arise, please do not hesitate to call for verification.   electronically signed by:  Felix Pacini, DO  [facility] - [place]

## 2023-10-11 ENCOUNTER — Ambulatory Visit (INDEPENDENT_AMBULATORY_CARE_PROVIDER_SITE_OTHER): Admitting: Family Medicine

## 2023-10-11 ENCOUNTER — Encounter: Payer: Self-pay | Admitting: Family Medicine

## 2023-10-11 ENCOUNTER — Other Ambulatory Visit (HOSPITAL_BASED_OUTPATIENT_CLINIC_OR_DEPARTMENT_OTHER): Payer: Self-pay

## 2023-10-11 VITALS — BP 118/72 | HR 68 | Temp 98.1°F | Wt 127.6 lb

## 2023-10-11 DIAGNOSIS — J320 Chronic maxillary sinusitis: Secondary | ICD-10-CM | POA: Diagnosis not present

## 2023-10-11 DIAGNOSIS — R21 Rash and other nonspecific skin eruption: Secondary | ICD-10-CM | POA: Diagnosis not present

## 2023-10-11 DIAGNOSIS — R053 Chronic cough: Secondary | ICD-10-CM

## 2023-10-11 MED ORDER — CLOTRIMAZOLE 1 % EX CREA
1.0000 | TOPICAL_CREAM | Freq: Two times a day (BID) | CUTANEOUS | 0 refills | Status: AC
  Filled 2023-10-11: qty 45, 23d supply, fill #0

## 2023-10-11 NOTE — Progress Notes (Signed)
 Anna May , May 05, 1971, 53 y.o., female MRN: [id number] Patient Care Team    Relationship Specialty Notifications Start End  Mariel Shope, DO PCP - General Family Medicine  03/27/16   Evert Hodgkins, NP Nurse Practitioner Nurse Practitioner  03/27/16    Comment: GYN NP for Anna Hunger, MD Consulting Physician Orthopedic Surgery  03/27/16   Lind Repine, MD Consulting Physician Pulmonary Disease  03/27/16   Avis Boehringer, MD Consulting Physician Dermatology  03/27/16   Thurman Flores, MD Consulting Physician Obstetrics and Gynecology  03/27/16   Loney Rivet, PT Physical Therapist Physical Therapy  12/13/18     Chief Complaint  Patient presents with   Rash    Over a week, on the chest. Redness, itching, white spots. Pt has not taken anything.      Subjective: Anna May is a 53 y.o. Pt presents for an OV with complaints of skin rash right upper chest of over 1 week duration.  Associated symptoms include red itchy, small white show lesions within.  Denies any plant exposure. Has been suffering from recurrent cough and sinusitis despite a round of doxycycline , azithromycin  and then a UTI treated with another antibiotic. She is now on allergy regimen and started on omeprazole .  Pt has tried Bactroban  ointment to ease their symptoms.      08/13/2023    3:43 PM 07/29/2021    8:35 AM 04/08/2020   10:44 AM 01/05/2020    3:58 PM 09/14/2017    9:28 AM  Depression screen PHQ 2/9  Decreased Interest 0 0 0 0 0  Down, Depressed, Hopeless 0 0 0 0 0  PHQ - 2 Score 0 0 0 0 0  Altered sleeping    0   Tired, decreased energy    0   Change in appetite    0   Feeling bad or failure about yourself     0   Trouble concentrating    0   Moving slowly or fidgety/restless    0   Suicidal thoughts    0   PHQ-9 Score    0   Difficult doing work/chores    Not difficult at all     Allergies  Allergen Reactions   Diflunisal Rash    ITCHING   Social History    Social History Narrative   Married to [place]. They have 2 children Ethelle Herb, [place])   Economist, BS degree.    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    Takes a daily vitamin. Drinks caffeine, uses herbal remedies.    Exercises routinely.   Smoke detector in the home.    Feels safe in her relationships.    Past Medical History:  Diagnosis Date   Acne 09/16/2012   ADHD    Anxiety    Decreased libido 09/16/2012   Insomnia 09/16/2012   Preventative health care 09/16/2012   Sun-damaged skin 09/16/2012   Past Surgical History:  Procedure Laterality Date   ANTERIOR CRUCIATE LIGAMENT REPAIR  01-2004   right leg - replacement   SKIN BIOPSY     multiple one with 17 stitches, 1 precancer lesion   WISDOM TOOTH EXTRACTION  1991   all 4   Family History  Problem Relation Age of Onset   Arthritis Mother        hands   Melanoma Maternal Grandmother    Brain cancer Maternal Grandmother    Heart attack Maternal Grandfather  Alcohol abuse Maternal Grandfather    Heart disease Maternal Grandfather    Stroke Maternal Grandfather    Kidney cancer Maternal Grandfather    Lung cancer Maternal Grandfather    Melanoma Paternal Grandmother    Lung cancer Paternal Grandfather    Colon polyps Neg Hx    Colon cancer Neg Hx    Esophageal cancer Neg Hx    Stomach cancer Neg Hx    Rectal cancer Neg Hx    Allergies as of 10/11/2023       Reactions   Diflunisal Rash   ITCHING        Medication List        Accurate as of October 11, 2023 12:08 PM. If you have any questions, ask your nurse or doctor.          clotrimazole 1 % cream Commonly known as: Clotrimazole Anti-Fungal Apply to affected area 2 times daily as directed. Started by: Napolean Backbone   ipratropium 0.06 % nasal spray Commonly known as: ATROVENT  Place 2 sprays into both nostrils 4 (four) times daily.   montelukast  10 MG tablet Commonly known as: Singulair  Take 1 tablet (10 mg total) by mouth at bedtime.    norethindrone-ethinyl estradiol-FE 1-20 MG-MCG tablet Commonly known as: LOESTRIN FE Take 1 tablet by mouth daily.   omeprazole  40 MG capsule Commonly known as: PRILOSEC Take 1 capsule (40 mg total) by mouth daily.        All past medical history, surgical history, allergies, family history, immunizations andmedications were updated in the EMR today and reviewed under the history and medication portions of their EMR.     ROS Negative, with the exception of above mentioned in HPI   Objective:  BP 118/72   Pulse 68   Temp 98.1 F (36.7 C)   Wt 127 lb 9.6 oz (57.9 kg)   SpO2 96%   BMI 24.92 kg/m  Body mass index is 24.92 kg/m. Physical Exam Vitals and nursing note reviewed.  Constitutional:      General: She is not in acute distress.    Appearance: Normal appearance. She is normal weight. She is not ill-appearing or toxic-appearing.  HENT:     Head: Normocephalic and atraumatic.  Eyes:     General: No scleral icterus.       Right eye: No discharge.        Left eye: No discharge.     Extraocular Movements: Extraocular movements intact.     Conjunctiva/sclera: Conjunctivae normal.     Pupils: Pupils are equal, round, and reactive to light.  Skin:    Findings: Rash (Patch of red raised skin, about the size amount on, over right upper chest with small weight a scarlike lesions.) present.  Neurological:     Mental Status: She is alert and oriented to person, place, and time. Mental status is at baseline.     Motor: No weakness.     Coordination: Coordination normal.     Gait: Gait normal.  Psychiatric:        Mood and Affect: Mood normal.        Behavior: Behavior normal.        Thought Content: Thought content normal.        Judgment: Judgment normal.      No results found. No results found. No results found for this or any previous visit (from the past 24 hours).  Assessment/Plan: Anna May is a 53 y.o. female present for OV for  Rash  (Primary) Rash appears fungal in nature.  Itchy. Start clotrimazole cream twice daily at least 2 weeks, 4 weeks if needed  Chronic cough/chronic sinusitis Will obtain sinus x-ray today and refer to ENT.  Believe she would benefit from nasal endoscopy. Continue PPI and allergy regimen - DG Sinuses Complete; Future - Ambulatory referral to ENT    Reviewed expectations re: course of current medical issues. Discussed self-management of symptoms. Outlined signs and symptoms indicating need for more acute intervention. Patient verbalized understanding and all questions were answered. Patient received an After-Visit Summary.    Orders Placed This Encounter  Procedures   DG Sinuses Complete   Ambulatory referral to ENT   Meds ordered this encounter  Medications   clotrimazole (CLOTRIMAZOLE ANTI-FUNGAL) 1 % cream    Sig: Apply to affected area 2 times daily as directed.    Dispense:  45 g    Refill:  0   Referral Orders         Ambulatory referral to ENT       Note is dictated utilizing voice recognition software. Although note has been proof read prior to signing, occasional typographical errors still can be missed. If any questions arise, please do not hesitate to call for verification.   electronically signed by:  Napolean Backbone, DO  [facility] [place]

## 2023-10-11 NOTE — Patient Instructions (Signed)
Skin Yeast Infection  A skin yeast infection is a condition in which there is an overgrowth of yeast (Candida) that normally lives on the skin. This condition usually occurs in areas of the skin that are constantly warm and moist, such as the skin under the breasts or armpits, or in the groin and other body folds. What are the causes? This condition is caused by a change in the normal balance of the yeast that live on the skin. What increases the risk? You are more likely to develop this condition if you: Are obese. Are pregnant. Are 44 years of age or older. Wear tight clothing. Have any of the following conditions: Diabetes. Malnutrition. A weak body defense system (immune system). Take medicines such as: Birth control pills. Antibiotics. Steroid medicines. What are the signs or symptoms? The most common symptom of this condition is itchiness in the affected area. Other symptoms include: A red, swollen area of the skin. Bumps on the skin. How is this diagnosed? This condition is diagnosed with a medical history and physical exam. Your health care provider may check for yeast by taking scrapings of the skin to be viewed under a microscope. How is this treated? This condition is treated with medicine. Medicines may be prescribed or available over the counter. The medicines may be: Taken by mouth (orally). Applied as a cream or powder to your skin. Follow these instructions at home:  Take or apply over-the-counter and prescription medicines only as told by your health care provider. Maintain a healthy weight. If you need help losing weight, talk with your health care provider. Keep your skin clean and dry. Wear loose-fitting clothing. If you have diabetes, keep your blood sugar under control. Keep all follow-up visits. This is important. Contact a health care provider if: Your symptoms go away and then come back. Your symptoms do not get better with treatment. Your symptoms get  worse. Your rash spreads. You have a fever or chills. You have new symptoms. You have new warmth or redness of your skin. Your rash is painful or bleeding. Summary A skin yeast infection is a condition in which there is an overgrowth of yeast (Candida) that normally lives on the skin. Take or apply over-the-counter and prescription medicines only as told by your health care provider. Keep your skin clean and dry. Contact a health care provider if your symptoms do not get better with treatment. This information is not intended to replace advice given to you by your health care provider. Make sure you discuss any questions you have with your health care provider. Document Revised: 08/20/2020 Document Reviewed: 08/20/2020 Elsevier Patient Education  2024 [employer].

## 2023-10-25 ENCOUNTER — Ambulatory Visit (INDEPENDENT_AMBULATORY_CARE_PROVIDER_SITE_OTHER): Admitting: Otolaryngology

## 2023-10-25 ENCOUNTER — Encounter (INDEPENDENT_AMBULATORY_CARE_PROVIDER_SITE_OTHER): Payer: Self-pay | Admitting: Otolaryngology

## 2023-10-25 ENCOUNTER — Ambulatory Visit (HOSPITAL_BASED_OUTPATIENT_CLINIC_OR_DEPARTMENT_OTHER)
Admission: RE | Admit: 2023-10-25 | Discharge: 2023-10-25 | Disposition: A | Discharge status: Home / Self Care | Source: Ambulatory Visit | Attending: Otolaryngology | Admitting: Otolaryngology

## 2023-10-25 ENCOUNTER — Other Ambulatory Visit (HOSPITAL_BASED_OUTPATIENT_CLINIC_OR_DEPARTMENT_OTHER): Payer: Self-pay

## 2023-10-25 VITALS — BP 131/85 | HR 73

## 2023-10-25 DIAGNOSIS — K219 Gastro-esophageal reflux disease without esophagitis: Secondary | ICD-10-CM

## 2023-10-25 DIAGNOSIS — R0982 Postnasal drip: Secondary | ICD-10-CM | POA: Diagnosis not present

## 2023-10-25 DIAGNOSIS — J3089 Other allergic rhinitis: Secondary | ICD-10-CM | POA: Diagnosis not present

## 2023-10-25 DIAGNOSIS — R053 Chronic cough: Secondary | ICD-10-CM | POA: Diagnosis present

## 2023-10-25 DIAGNOSIS — R0981 Nasal congestion: Secondary | ICD-10-CM | POA: Diagnosis not present

## 2023-10-25 DIAGNOSIS — J343 Hypertrophy of nasal turbinates: Secondary | ICD-10-CM

## 2023-10-25 MED ORDER — FLUTICASONE PROPIONATE 50 MCG/ACT NA SUSP
2.0000 | Freq: Two times a day (BID) | NASAL | 6 refills | Status: AC
  Filled 2023-10-25: qty 16, 15d supply, fill #0

## 2023-10-25 MED ORDER — TRAMADOL HCL 50 MG PO TABS
50.0000 mg | ORAL_TABLET | Freq: Four times a day (QID) | ORAL | 0 refills | Status: AC | PRN
  Filled 2023-10-25: qty 12, 3d supply, fill #0

## 2023-10-25 NOTE — Patient Instructions (Addendum)

## 2023-10-25 NOTE — Progress Notes (Signed)
 ENT CONSULT:  Reason for Consult: chronic cough    HPI: Discussed the use of AI scribe software for clinical note transcription with the patient, who gave verbal consent to proceed.  History of Present Illness Anna May is a 53 year old female who presents with chronic cough and sinus issues following a COVID-19 and flu infection.  She has been experiencing a persistent cough and sinus issues since contracting COVID-19 and the flu simultaneously on February 8th, 2025, after a work trip to [facility]. Initial treatment with antibiotics resolved the acute symptoms, but she continues to have significant postnasal drip and a chronic cough.  The cough is persistent, requiring frequent throat clearing, and is exacerbated by talking and eating. 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 She has tried various allergy medications, including Xyzal, Benadryl, and Allertec, without relief. A nasal spray used in the mornings provides some relief, but symptoms persist.  She has not undergone a chest x-ray or formal allergy testing. No history of allergies or sinus surgery. She has been on heartburn medication for a month due to concerns about acid reflux contributing to her symptoms, but reports no improvement and denies experiencing heartburn.  She experiences nasal congestion, particularly at night, and feels that the sensation of mucus is high in her throat, possibly related to her sinuses. She has a history of lung damage from undiagnosed walking pneumonia in 2017.  The chronic cough has led to urinary incontinence issues during couging, which are exacerbated by the coughing episodes. She describes the incontinence as occurring all day, every day, and notes that it is exhausting. She walks three to four miles daily, which also triggers incontinence.  She has not experienced improvement in her symptoms over the past three months and is seeking further  evaluation and management options.   Records Reviewed:  Family Medicine 09/08/23 52yo female with no significant PMH presents today due to concerns of continued cough and post nasal drainage that has been ongoing for nearly two months. She was seen on 08/13/23 and given 5 days of prednisone  and 7 days of doxycycline . She completed this course and states there was no change, in fact worsening symptoms. All sx started after contracting both covid and influenza at the beginning of Feb and feels like this is secondary to that. She reports significant sinus pressure and congestion, post nasal drainage, chronic, persistent cough that is intermittently productive. Pt does have hx of allergies and has been taking xyzal every night but feels this has not helped with the drainage. Additionally she has been using sinus spray "spritzing" her nose. No fevers, chills, body aches. No CP or palpitations. No wheezing.   Lower respiratory tract infection due to COVID-19 virus -     Azithromycin ; Take 2 tablets on day 1, then 1 tablet daily on days 2 through 5  Dispense: 6 tablet; Refill: 0 -     dexAMETHasone ; Take 1.5 tablets (6 mg total) by mouth daily for 5 days. Discard remainder.  Dispense: 8 tablet; Refill: 0 -     Montelukast  Sodium; Take 1 tablet (10 mg total) by mouth at bedtime.  Dispense: 14 tablet; Refill: 0 -     Promethazine -DM; Take 5 mLs by mouth at bedtime as needed for cough.  Dispense: 60 mL; Refill: 0    Suspect post-covid lower respiratory tract infection. VSS. Pt failed to respond to doxy. Suspect an inflammatory component still present. No wheezing. Will start decadron   and azithromycin . Add singular to target allergic component. Promethazine  DM as needed. RTC precautions discussed  F/u visit 09/20/23 Anna May is a 53 y.o. Pt presents for an OV with complaints persistent cough after illness that started in February in which she had influenza and COVID shortly thereafter.  She has been  treated with doxycycline , prednisone  and then seen again with persistence of the symptoms and treated with azithromycin  and steroid.  Patient reports her cough is still persistent with hoarseness/strain in her voice.  She denies any additional fevers or chills. She does not endorse shortness of breath. She endorses a postnasal drip, cough that is worse at night when laying down and after meals.  Anna May is a 53 y.o. female present for OV for  Laryngopharyngeal reflux (LPR) (Primary) Patient's persistent throat clearing type of cough seems to be more consistent with LPR.  We discussed the irritation from the illness and cough, then creates a LPR inflammation that again creates a cough. Start omeprazole  40 mg daily for 4 weeks, if symptoms quickly return then extend course to 3 months. Avoid acidic content foods or spicy foods during this time.   Seasonal allergic rhinitis due to pollen Postnasal drip is present on exam today.  Lung exam is clear. Suspect her persistent cough is a combination of LPR and postnasal drip, creating irritation of her throat. Continue antihistamine of choice. Continue Singulair  Start Atrovent  nasal spray 4 times daily Start the omeprazole  daily - montelukast  (SINGULAIR ) 10 MG tablet; Take 1 tablet (10 mg total) by mouth at bedtime.  Dispense: 90 tablet; Refill: 1 Patient advised that this treatment plan can take few weeks to a month to see improvement in symptoms.     Past Medical History:  Diagnosis Date   Acne 09/16/2012   ADHD    Anxiety    Decreased libido 09/16/2012   Insomnia 09/16/2012   Preventative health care 09/16/2012   Sun-damaged skin 09/16/2012    Past Surgical History:  Procedure Laterality Date   ANTERIOR CRUCIATE LIGAMENT REPAIR  01-2004   right leg - replacement   SKIN BIOPSY     multiple one with 17 stitches, 1 precancer lesion   WISDOM TOOTH EXTRACTION  1991   all 4    Family History  Problem Relation Age of Onset   Arthritis  Mother        hands   Melanoma Maternal Grandmother    Brain cancer Maternal Grandmother    Heart attack Maternal Grandfather    Alcohol abuse Maternal Grandfather    Heart disease Maternal Grandfather    Stroke Maternal Grandfather    Kidney cancer Maternal Grandfather    Lung cancer Maternal Grandfather    Melanoma Paternal Grandmother    Lung cancer Paternal Grandfather    Colon polyps Neg Hx    Colon cancer Neg Hx    Esophageal cancer Neg Hx    Stomach cancer Neg Hx    Rectal cancer Neg Hx     Social History:  reports that she has never smoked. She has never used smokeless tobacco. She reports that she does not drink alcohol and does not use drugs.  Allergies:  Allergies  Allergen Reactions   Diflunisal Rash    ITCHING    Medications: I have reviewed the patient's current medications.  The PMH, PSH, Medications, Allergies, and SH were reviewed and updated.  ROS: Constitutional: Negative for fever, weight loss and weight gain. Cardiovascular: Negative for chest pain and dyspnea on  exertion. Respiratory: Is not experiencing shortness of breath at rest. Gastrointestinal: Negative for nausea and vomiting. Neurological: Negative for headaches. Psychiatric: The patient is not nervous/anxious  Blood pressure 131/85, pulse 73, SpO2 98%. There is no height or weight on file to calculate BMI.  PHYSICAL EXAM:  Exam: General: Well-developed, well-nourished Communication and Voice: Clear pitch and clarity Respiratory Respiratory effort: Equal inspiration and expiration without stridor Cardiovascular Peripheral Vascular: Warm extremities with equal color/perfusion Eyes: No nystagmus with equal extraocular motion bilaterally Neuro/Psych/Balance: Patient oriented to person, place, and time; Appropriate mood and affect; Gait is intact with no imbalance; Cranial nerves I-XII are intact Head and Face Inspection: Normocephalic and atraumatic without mass or lesion Palpation:  Facial skeleton intact without bony stepoffs Salivary Glands: No mass or tenderness Facial Strength: Facial motility symmetric and full bilaterally ENT Pinna: External ear intact and fully developed External canal: Canal is patent with intact skin Tympanic Membrane: Clear and mobile External Nose: No scar or anatomic deformity Internal Nose: Septum is S-shaped/deviated. No polyp, or purulence. Mucosal edema and erythema present.  Bilateral inferior turbinate hypertrophy.  Lips, Teeth, and gums: Mucosa and teeth intact and viable TMJ: No pain to palpation with full mobility Oral cavity/oropharynx: No erythema or exudate, no lesions present Nasopharynx: No mass or lesion with intact mucosa Hypopharynx: Intact mucosa without pooling of secretions Larynx Glottic: Full true vocal cord mobility without lesion or mass Supraglottic: Normal appearing epiglottis and AE folds Interarytenoid Space: Moderate pachydermia&edema Subglottic Space: Patent without lesion or edema Neck Neck and Trachea: Midline trachea without mass or lesion Thyroid: No mass or nodularity Lymphatics: No lymphadenopathy  Procedure: Preoperative diagnosis: chronic cough   Postoperative diagnosis:   Same + GERD LPR   Procedure: Flexible fiberoptic laryngoscopy  Surgeon: Mikaelah Trostle, MD  Anesthesia: Topical lidocaine and Afrin Complications: None Condition is stable throughout exam  Indications and consent:  The patient presents to the clinic with above symptoms. Indirect laryngoscopy view was incomplete. Thus it was recommended that they undergo a flexible fiberoptic laryngoscopy. All of the risks, benefits, and potential complications were reviewed with the patient preoperatively and verbal informed consent was obtained.  Procedure: The patient was seated upright in the clinic. Topical lidocaine and Afrin were applied to the nasal cavity. After adequate anesthesia had occurred, I then proceeded to pass the  flexible telescope into the nasal cavity. The nasal cavity was patent without rhinorrhea or polyp. The nasopharynx was also patent without mass or lesion. The base of tongue was visualized and was normal. There were no signs of pooling of secretions in the piriform sinuses. The true vocal folds were mobile bilaterally. There were no signs of glottic or supraglottic mucosal lesion or mass. There was moderate interarytenoid pachydermia and post cricoid edema. The telescope was then slowly withdrawn and the patient tolerated the procedure throughout.    PROCEDURE NOTE: nasal endoscopy  Preoperative diagnosis: chronic sinusitis symptoms  Postoperative diagnosis: same  Procedure: Diagnostic nasal endoscopy (16109)  Surgeon: Artice Last, M.D.  Anesthesia: Topical lidocaine and Afrin  H&P REVIEW: The patient's history and physical were reviewed today prior to procedure. All medications were reviewed and updated as well. Complications: None Condition is stable throughout exam Indications and consent: The patient presents with symptoms of chronic sinusitis not responding to previous therapies. All the risks, benefits, and potential complications were reviewed with the patient preoperatively and informed consent was obtained. The time out was completed with confirmation of the correct procedure.   Procedure: The patient was  seated upright in the clinic. Topical lidocaine and Afrin were applied to the nasal cavity. After adequate anesthesia had occurred, the rigid nasal endoscope was passed into the nasal cavity. The nasal mucosa, turbinates, septum, and sinus drainage pathways were visualized bilaterally. This revealed no purulence or significant secretions that might be cultured. There were no polyps or sites of significant inflammation. The mucosa was intact and there was no crusting present. The scope was then slowly withdrawn and the patient tolerated the procedure well. There were no complications  or blood loss.    Studies Reviewed: CXR 05/28/2015 FINDINGS: The lungs are well-expanded. There is no focal infiltrate. There is no pleural effusion. The heart and pulmonary vascularity are normal. The mediastinum is normal in width. The bony thorax exhibits no acute abnormality.   IMPRESSION: Mild hyperinflation may be voluntary or could reflect acute bronchitis. There is no evidence of pneumonia nor CHF.  Assessment/Plan: Encounter Diagnoses  Name Primary?   Chronic cough Yes   Chronic nasal congestion    Environmental and seasonal allergies    Post-nasal drip    Chronic GERD    Hypertrophy of both inferior nasal turbinates     Assessment and Plan Assessment & Plan Chronic cough with postnasal drainage Chronic cough for three months post-COVID-19 with postnasal drainage, likely non-infectious. Differential includes postnasal drainage, reflux-related cough, and neurogenic cough. Symptoms worsen with talking and eating, no improvement from current treatments. Possible neurogenic cough due to persistent cough post-infection. - Order chest x-ray to rule out lung involvement. - Prescribed Flonase nasal spray to manage postnasal drainage.Already on Atrovent  spray given by PCP (will continue) - Recommend nasal saline rinses using a NeilMed bottle to reduce nasal drainage. - Refer to pulmonology for pulmonary function tests to rule out asthma or other lung diseases. - Refer to allergy specialist for allergy testing and management if detected - Reassess in three months to evaluate for neurogenic cough if symptoms persist.  GERD LPR Cough possibly exacerbated by reflux, especially postprandial cough. Flexible laryngoscopy with evidence of post-cricoid edema pachydermia. No heartburn symptoms. Started on PPI by PCP.  Discussed reflux as a possible contributor to her cough. - Recommend Reflux Gourmet supplement post-meals. - Advise dietary modifications to reduce reflux, including  reducing caffeine intake, avoiding late meals, and elevating the head of the bed during sleep. - continue PPI for now  Thank you for allowing me to participate in the care of this patient. Please do not hesitate to contact me with any questions or concerns.   Artice Last, MD Otolaryngology [facility] Phone: [phone] Fax: [phone]    10/25/2023, 12:33 PM

## 2023-10-29 ENCOUNTER — Telehealth (INDEPENDENT_AMBULATORY_CARE_PROVIDER_SITE_OTHER): Payer: Self-pay

## 2023-10-29 ENCOUNTER — Telehealth: Payer: Self-pay

## 2023-10-29 NOTE — Telephone Encounter (Signed)
 Pt made aware

## 2023-10-29 NOTE — Telephone Encounter (Signed)
 Communication  Reason for CRM: Patient called stating she's been taking a acid reflux medication for about month and nothing resolve at all. Patient is requesting if [facility] can prescribed something else instead.   Please advise.

## 2023-10-29 NOTE — Telephone Encounter (Signed)
 I gave patient her results for her chest x-ray. She had a question about switching her medicine to protonix  for acid reflux. The initial medication that was prescribed was prilosec and her pcp prescribed it. She was wondering if she could be prescibed the protonix  because her father had good improvement on the medication and because the prilosec that she has been taking seems to not be making noticeable improvement. I told her to talk to her pcp and if she could not get in contact with them to call back Monday. Please advise.

## 2023-11-01 ENCOUNTER — Other Ambulatory Visit (HOSPITAL_BASED_OUTPATIENT_CLINIC_OR_DEPARTMENT_OTHER): Payer: Self-pay

## 2023-11-01 ENCOUNTER — Other Ambulatory Visit (INDEPENDENT_AMBULATORY_CARE_PROVIDER_SITE_OTHER): Payer: Self-pay | Admitting: Otolaryngology

## 2023-11-01 MED ORDER — PANTOPRAZOLE SODIUM 20 MG PO TBEC
20.0000 mg | DELAYED_RELEASE_TABLET | Freq: Every day | ORAL | 3 refills | Status: AC
  Filled 2023-11-01: qty 30, 30d supply, fill #0

## 2023-11-05 ENCOUNTER — Encounter (HOSPITAL_BASED_OUTPATIENT_CLINIC_OR_DEPARTMENT_OTHER): Payer: Self-pay

## 2023-11-18 ENCOUNTER — Telehealth: Payer: Self-pay

## 2023-11-18 NOTE — Telephone Encounter (Signed)
 Reminder for mammogram sent. Requested records from GYN.

## 2023-12-09 ENCOUNTER — Ambulatory Visit: Payer: Self-pay | Admitting: Allergy

## 2024-01-27 ENCOUNTER — Ambulatory Visit (INDEPENDENT_AMBULATORY_CARE_PROVIDER_SITE_OTHER): Admitting: Otolaryngology
